# Patient Record
Sex: Male | Born: 1935 | Race: White | Hispanic: No | Smoking: Former smoker
Health system: Southern US, Community
[De-identification: ages and names within clinical notes are randomized; demographics above are authoritative.]

## PROBLEM LIST (undated history)

## (undated) DIAGNOSIS — I10 Essential (primary) hypertension: Secondary | ICD-10-CM

## (undated) DIAGNOSIS — G519 Disorder of facial nerve, unspecified: Secondary | ICD-10-CM

## (undated) DIAGNOSIS — N200 Calculus of kidney: Secondary | ICD-10-CM

## (undated) DIAGNOSIS — R1031 Right lower quadrant pain: Secondary | ICD-10-CM

## (undated) DIAGNOSIS — I619 Nontraumatic intracerebral hemorrhage, unspecified: Secondary | ICD-10-CM

## (undated) DIAGNOSIS — I629 Nontraumatic intracranial hemorrhage, unspecified: Secondary | ICD-10-CM

## (undated) DIAGNOSIS — E039 Hypothyroidism, unspecified: Secondary | ICD-10-CM

## (undated) DIAGNOSIS — I2510 Atherosclerotic heart disease of native coronary artery without angina pectoris: Secondary | ICD-10-CM

## (undated) DIAGNOSIS — E785 Hyperlipidemia, unspecified: Secondary | ICD-10-CM

## (undated) DIAGNOSIS — N281 Cyst of kidney, acquired: Secondary | ICD-10-CM

## (undated) DIAGNOSIS — E119 Type 2 diabetes mellitus without complications: Secondary | ICD-10-CM

## (undated) DIAGNOSIS — Z951 Presence of aortocoronary bypass graft: Secondary | ICD-10-CM

## (undated) HISTORY — DX: Presence of aortocoronary bypass graft: Z95.1

## (undated) HISTORY — DX: Cyst of kidney, acquired: N28.1

## (undated) HISTORY — DX: Right lower quadrant pain: R10.31

## (undated) HISTORY — DX: Nontraumatic intracranial hemorrhage, unspecified: I62.9

## (undated) HISTORY — DX: Hyperlipidemia, unspecified: E78.5

## (undated) HISTORY — DX: Nontraumatic intracerebral hemorrhage, unspecified: I61.9

## (undated) HISTORY — DX: Hypothyroidism, unspecified: E03.9

## (undated) HISTORY — DX: Calculus of kidney: N20.0

## (undated) HISTORY — DX: Type 2 diabetes mellitus without complications: E11.9

## (undated) HISTORY — DX: Disorder of facial nerve, unspecified: G51.9

## (undated) HISTORY — DX: Essential (primary) hypertension: I10

## (undated) HISTORY — DX: Atherosclerotic heart disease of native coronary artery without angina pectoris: I25.10

---

## 2006-08-01 ENCOUNTER — Emergency Department: Payer: Self-pay | Admitting: Emergency Medicine

## 2006-08-01 ENCOUNTER — Other Ambulatory Visit: Payer: Self-pay

## 2012-05-09 HISTORY — PX: CARDIAC SURGERY: SHX584

## 2012-06-11 ENCOUNTER — Ambulatory Visit: Payer: Self-pay | Admitting: Cardiology

## 2012-06-11 DIAGNOSIS — E785 Hyperlipidemia, unspecified: Secondary | ICD-10-CM

## 2012-06-11 DIAGNOSIS — I2510 Atherosclerotic heart disease of native coronary artery without angina pectoris: Secondary | ICD-10-CM

## 2012-06-11 DIAGNOSIS — I10 Essential (primary) hypertension: Secondary | ICD-10-CM

## 2012-06-11 HISTORY — DX: Atherosclerotic heart disease of native coronary artery without angina pectoris: I25.10

## 2012-06-11 HISTORY — DX: Hyperlipidemia, unspecified: E78.5

## 2012-06-11 HISTORY — DX: Essential (primary) hypertension: I10

## 2012-06-15 DIAGNOSIS — E119 Type 2 diabetes mellitus without complications: Secondary | ICD-10-CM

## 2012-06-15 DIAGNOSIS — E039 Hypothyroidism, unspecified: Secondary | ICD-10-CM

## 2012-06-15 DIAGNOSIS — Z951 Presence of aortocoronary bypass graft: Secondary | ICD-10-CM | POA: Insufficient documentation

## 2012-06-15 HISTORY — DX: Presence of aortocoronary bypass graft: Z95.1

## 2012-06-15 HISTORY — DX: Type 2 diabetes mellitus without complications: E11.9

## 2012-06-15 HISTORY — DX: Hypothyroidism, unspecified: E03.9

## 2013-04-25 ENCOUNTER — Emergency Department: Payer: Self-pay | Admitting: Emergency Medicine

## 2013-04-25 DIAGNOSIS — I629 Nontraumatic intracranial hemorrhage, unspecified: Secondary | ICD-10-CM | POA: Insufficient documentation

## 2013-04-25 HISTORY — DX: Nontraumatic intracranial hemorrhage, unspecified: I62.9

## 2013-04-25 LAB — CBC WITH DIFFERENTIAL/PLATELET
Basophil #: 0.1 10*3/uL (ref 0.0–0.1)
Basophil %: 0.4 %
Eosinophil #: 0 10*3/uL (ref 0.0–0.7)
Eosinophil %: 0.2 %
HCT: 42.9 % (ref 40.0–52.0)
HGB: 14.2 g/dL (ref 13.0–18.0)
Lymphocyte #: 1.5 10*3/uL (ref 1.0–3.6)
Lymphocyte %: 8.7 %
MCH: 27.9 pg (ref 26.0–34.0)
MCHC: 33.1 g/dL (ref 32.0–36.0)
MCV: 84 fL (ref 80–100)
Monocyte #: 0.9 x10 3/mm (ref 0.2–1.0)
Monocyte %: 5 %
Neutrophil #: 14.7 10*3/uL — ABNORMAL HIGH (ref 1.4–6.5)
Neutrophil %: 85.7 %
Platelet: 216 10*3/uL (ref 150–440)
RBC: 5.08 10*6/uL (ref 4.40–5.90)
RDW: 13.6 % (ref 11.5–14.5)
WBC: 17.1 10*3/uL — ABNORMAL HIGH (ref 3.8–10.6)

## 2013-04-25 LAB — COMPREHENSIVE METABOLIC PANEL
Alkaline Phosphatase: 87 U/L
Anion Gap: 8 (ref 7–16)
BUN: 18 mg/dL (ref 7–18)
Bilirubin,Total: 0.4 mg/dL (ref 0.2–1.0)
Calcium, Total: 9.9 mg/dL (ref 8.5–10.1)
Co2: 27 mmol/L (ref 21–32)
EGFR (Non-African Amer.): >60
SGOT(AST): 8 U/L — ABNORMAL LOW (ref 15–37)
SGPT (ALT): 19 U/L (ref 12–78)
Sodium: 142 mmol/L (ref 136–145)
Total Protein: 7.2 g/dL (ref 6.4–8.2)

## 2013-04-25 LAB — TROPONIN I: Troponin-I: <0.02 ng/mL

## 2013-04-26 DIAGNOSIS — I619 Nontraumatic intracerebral hemorrhage, unspecified: Secondary | ICD-10-CM

## 2013-04-26 HISTORY — DX: Nontraumatic intracerebral hemorrhage, unspecified: I61.9

## 2013-05-16 DIAGNOSIS — G519 Disorder of facial nerve, unspecified: Secondary | ICD-10-CM

## 2013-05-16 DIAGNOSIS — G510 Bell's palsy: Secondary | ICD-10-CM | POA: Insufficient documentation

## 2013-05-16 HISTORY — DX: Disorder of facial nerve, unspecified: G51.9

## 2015-02-09 ENCOUNTER — Other Ambulatory Visit: Payer: Self-pay | Admitting: Internal Medicine

## 2015-02-09 DIAGNOSIS — R1084 Generalized abdominal pain: Secondary | ICD-10-CM

## 2015-02-11 ENCOUNTER — Ambulatory Visit
Admission: RE | Admit: 2015-02-11 | Discharge: 2015-02-11 | Disposition: A | Discharge status: Home / Self Care | Payer: Medicare Other | Source: Ambulatory Visit | Attending: Internal Medicine | Admitting: Internal Medicine

## 2015-02-11 DIAGNOSIS — R1084 Generalized abdominal pain: Secondary | ICD-10-CM | POA: Diagnosis present

## 2015-02-11 DIAGNOSIS — I714 Abdominal aortic aneurysm, without rupture: Secondary | ICD-10-CM | POA: Diagnosis not present

## 2015-02-11 DIAGNOSIS — N281 Cyst of kidney, acquired: Secondary | ICD-10-CM | POA: Diagnosis not present

## 2015-03-13 ENCOUNTER — Ambulatory Visit: Payer: Self-pay | Admitting: Urology

## 2015-03-17 ENCOUNTER — Ambulatory Visit (INDEPENDENT_AMBULATORY_CARE_PROVIDER_SITE_OTHER): Payer: Medicare Other | Admitting: Urology

## 2015-03-17 ENCOUNTER — Encounter: Payer: Self-pay | Admitting: Urology

## 2015-03-17 VITALS — BP 136/89 | HR 102 | Ht 68.0 in | Wt 163.5 lb

## 2015-03-17 DIAGNOSIS — N281 Cyst of kidney, acquired: Secondary | ICD-10-CM

## 2015-03-17 DIAGNOSIS — N200 Calculus of kidney: Secondary | ICD-10-CM

## 2015-03-17 DIAGNOSIS — Q6100 Congenital renal cyst, unspecified: Secondary | ICD-10-CM

## 2015-03-17 DIAGNOSIS — R1031 Right lower quadrant pain: Secondary | ICD-10-CM | POA: Diagnosis not present

## 2015-03-17 HISTORY — DX: Calculus of kidney: N20.0

## 2015-03-17 HISTORY — DX: Right lower quadrant pain: R10.31

## 2015-03-17 HISTORY — DX: Cyst of kidney, acquired: N28.1

## 2015-03-17 NOTE — Progress Notes (Signed)
03/17/2015 3:01 PM   Christopher Parrish 1935-09-16 [id number]  Referring provider: Jaclyn Shaggy, MD [address]   [place], [place] [address]  Chief Complaint  Patient presents with  . New Patient (Initial Visit)    bilateral renal cyst     HPI:  1 - Bilateral Renal Cysts - Rt 8cm, Lt 3cm renal cysts by [place] 02/2015 on eval RLQ pain. No obvious hypervascular nodules / mass effect / hydro. No contrast axial imaging previously.  2 - Nephrolithiasis - prior medical passage in 1970s and 1980s. No prior instrumentation.  3 - Right Lower Quadrant Pain - pt with mild bother form occasional RLQ pain. No relation to full / empty bladder or full / empty bowel. No hernias on exam. Does not localize to flank / renal area. Overall modest bother.  PMH sig for CAD/CABG (presently non-limiting). He is retired from [employer] " is seen as new patient for above.    PMH: Past Medical History  Diagnosis Date  . Arteriosclerosis of coronary artery 06/11/2012  . BP (high blood pressure) 06/11/2012  . Diabetes mellitus (HCC) 06/15/2012  . Adult hypothyroidism 06/15/2012  . HLD (hyperlipidemia) 06/11/2012    Surgical History: No past surgical history on file.  Home Medications:    Medication List       This list is accurate as of: 03/17/15  3:01 PM.  Always use your most recent med list.               aspirin EC 81 MG tablet  Take by mouth.     BENADRYL 25 MG tablet  Generic drug:  diphenhydrAMINE  Take 25 mg by mouth every 6 (six) hours as needed.     levothyroxine 100 MCG tablet  Commonly known as:  SYNTHROID, LEVOTHROID  Take 100 mcg by mouth daily before breakfast.     lisinopril-hydrochlorothiazide 10-12.5 MG tablet  Commonly known as:  PRINZIDE,ZESTORETIC  Take 1 tablet by mouth daily.     loratadine 10 MG tablet  Commonly known as:  CLARITIN  Take by mouth.     omeprazole 20 MG capsule  Commonly known as:  PRILOSEC  Take by mouth.         Allergies: No Known Allergies  Family History: No family history on file.  Social History:  has no tobacco, alcohol, and drug history on file.  ROS:     Gastrointestinal (upper)  : Negative for upper GI symptoms  Gastrointestinal (lower) : Negative for lower GI symptoms  Constitutional : Negative for symptoms  Skin: Negative for skin symptoms  Eyes: Negative for eye symptoms  Ear/Nose/Throat : Negative for Ear/Nose/Throat symptoms  Hematologic/Lymphatic: Negative for Hematologic/Lymphatic symptoms  Cardiovascular : Negative for cardiovascular symptoms  Respiratory : Negative for respiratory symptoms  Endocrine: Negative for endocrine symptoms  Musculoskeletal: Negative for musculoskeletal symptoms  Neurological: Negative for neurological symptoms  Psychologic: Negative for psychiatric symptoms       Physical Exam: There were no vitals taken for this visit.  Constitutional:  Alert and oriented, No acute distress. HEENT: [place] AT, moist mucus membranes.  Trachea midline, no masses. Cardiovascular: No clubbing, cyanosis, or edema. Respiratory: Normal respiratory effort, no increased work of breathing. GI: Abdomen is soft, nontender, nondistended, no abdominal masses GU: No CVA tenderness. Very mild RLQ pain withtou rebound. No palpable mases.  Skin: No rashes, bruises or suspicious lesions. Lymph: No cervical or inguinal adenopathy. Neurologic: Grossly intact, no focal deficits,  moving all 4 extremities. Psychiatric: Normal mood and affect.  Laboratory Data: Lab Results  Component Value Date   WBC 17.1* 04/25/2013   HGB 14.2 04/25/2013   HCT 42.9 04/25/2013   MCV 84 04/25/2013   PLT 216 04/25/2013    Lab Results  Component Value Date   CREATININE 1.02 04/25/2013    No results found for: PSA  No results found for: TESTOSTERONE  No results found for: HGBA1C  Urinalysis No results found for: COLORURINE, APPEARANCEUR, LABSPEC,  PHURINE, GLUCOSEU, HGBUR, BILIRUBINUR, KETONESUR, PROTEINUR, UROBILINOGEN, NITRITE, LEUKOCYTESUR  Pertinent Imaging:   Assessment & Plan:  1 - Bilateral Renal Cysts - likely benign cysts. Rec CT abd-pelvis with and without on return to maximally r/o neoplasm or major mass effect. This will also help r/o worriseom etiology to abd pain as per below.   2 - Nephrolithiasis - no recent colic. Imaging on return to r/o any sig ureteral stones as cause of below symptoms.   3 - Right Lower Quadrant Pain - exam reassuring, favor MSK source. Imaging as per above to r/o stone or GU neoplastic etiology.   4 - BMP today, RTC with CT, if all is favorable, then prn.   No Follow-up on file.  Sebastian AcheMANNY, Katja Blue, MD  [facility] [address], [address] [place], [place] [address] [phone]

## 2015-03-18 LAB — BASIC METABOLIC PANEL
BUN / CREAT RATIO: 14 (ref 10–22)
BUN: 15 mg/dL (ref 8–27)
CO2: 26 mmol/L (ref 18–29)
CREATININE: 1.04 mg/dL (ref 0.76–1.27)
Calcium: 10.1 mg/dL (ref 8.6–10.2)
Chloride: 98 mmol/L (ref 97–106)
GFR calc Af Amer: 80 mL/min/{1.73_m2} (ref >59)
GFR, EST NON AFRICAN AMERICAN: 69 mL/min/{1.73_m2} (ref >59)
GLUCOSE: 98 mg/dL (ref 65–99)
Potassium: 4 mmol/L (ref 3.5–5.2)
SODIUM: 140 mmol/L (ref 136–144)

## 2015-03-24 ENCOUNTER — Ambulatory Visit
Admission: RE | Admit: 2015-03-24 | Discharge: 2015-03-24 | Disposition: A | Discharge status: Home / Self Care | Payer: Medicare Other | Source: Ambulatory Visit | Attending: Urology | Admitting: Urology

## 2015-03-24 DIAGNOSIS — N200 Calculus of kidney: Secondary | ICD-10-CM | POA: Insufficient documentation

## 2015-03-24 DIAGNOSIS — N281 Cyst of kidney, acquired: Secondary | ICD-10-CM | POA: Diagnosis not present

## 2015-03-24 DIAGNOSIS — R1031 Right lower quadrant pain: Secondary | ICD-10-CM

## 2015-03-24 MED ORDER — IOHEXOL 300 MG/ML  SOLN
100.0000 mL | Freq: Once | INTRAMUSCULAR | Status: AC | PRN
  Administered 2015-03-24: 100 mL via INTRAVENOUS

## 2015-04-08 ENCOUNTER — Encounter: Payer: Self-pay | Admitting: Urology

## 2015-04-08 ENCOUNTER — Ambulatory Visit (INDEPENDENT_AMBULATORY_CARE_PROVIDER_SITE_OTHER): Payer: Medicare Other | Admitting: Urology

## 2015-04-08 VITALS — BP 145/77 | HR 85 | Ht 68.0 in | Wt 168.3 lb

## 2015-04-08 DIAGNOSIS — N200 Calculus of kidney: Secondary | ICD-10-CM

## 2015-04-08 DIAGNOSIS — Q6102 Congenital multiple renal cysts: Secondary | ICD-10-CM

## 2015-04-08 DIAGNOSIS — N281 Cyst of kidney, acquired: Secondary | ICD-10-CM

## 2015-04-08 NOTE — Progress Notes (Signed)
04/08/2015 2:48 PM   Arsenio Lacretia Nicks Excell Seltzer 09/26/1935 [id number]  Referring provider: Jaclyn Shaggy, MD [address]   [place], [place] [address]  Chief Complaint  Patient presents with  . Follow-up    CT scan, result cyst    HPI: 1 - Bilateral Renal Cysts - the patient bilateral benign renal cysts on CT scan.  2 - Nephrolithiasis - the patient has a 1.1 cm right upper pole stone and a 7 mm left lower pole stone.  3 - Right Lower Quadrant Pain - there is no radiological or physical exam findings to suggest a urological cause.  PMH sig for CAD/CABG (presently non-limiting). He is retired from [employer]     PMH: Past Medical History  Diagnosis Date  . Arteriosclerosis of coronary artery 06/11/2012  . BP (high blood pressure) 06/11/2012  . Adult hypothyroidism 06/15/2012  . HLD (hyperlipidemia) 06/11/2012    Surgical History: Past Surgical History  Procedure Laterality Date  . Cardiac surgery  2014    bypass    Home Medications:    Medication List       This list is accurate as of: 04/08/15  2:48 PM.  Always use your most recent med list.               aspirin EC 81 MG tablet  Take by mouth.     BENADRYL 25 MG tablet  Generic drug:  diphenhydrAMINE  Take 25 mg by mouth every 6 (six) hours as needed.     levothyroxine 100 MCG tablet  Commonly known as:  SYNTHROID, LEVOTHROID  Take 100 mcg by mouth daily before breakfast.     lisinopril-hydrochlorothiazide 10-12.5 MG tablet  Commonly known as:  PRINZIDE,ZESTORETIC  Take 1 tablet by mouth daily.     loratadine 10 MG tablet  Commonly known as:  CLARITIN  Take by mouth.     omeprazole 20 MG capsule  Commonly known as:  PRILOSEC  Take by mouth.        Allergies: No Known Allergies  Family History: Family History  Problem Relation Age of Onset  . Cancer Neg Hx   . Diabetes Brother     Social History:  reports that he quit smoking about 27 years ago. He does not have any smokeless  tobacco history on file. He reports that he does not drink alcohol or use illicit drugs.  ROS: UROLOGY Frequent Urination?: No Hard to postpone urination?: No Burning/pain with urination?: No Get up at night to urinate?: No Leakage of urine?: No Urine stream starts and stops?: No Trouble starting stream?: No Do you have to strain to urinate?: No Blood in urine?: No Urinary tract infection?: No Sexually transmitted disease?: No Injury to kidneys or bladder?: No Painful intercourse?: No Weak stream?: No Erection problems?: No Penile pain?: No  Gastrointestinal Nausea?: No Vomiting?: No Indigestion/heartburn?: No Diarrhea?: No Constipation?: No  Constitutional Fever: No Night sweats?: No Weight loss?: No Fatigue?: No  Skin Skin rash/lesions?: No Itching?: No  Eyes Blurred vision?: No Double vision?: No  Ears/Nose/Throat Sore throat?: No Sinus problems?: No  Hematologic/Lymphatic Swollen glands?: No Easy bruising?: No  Cardiovascular Chest pain?: No  Respiratory Cough?: No Shortness of breath?: No  Endocrine Excessive thirst?: No  Musculoskeletal Back pain?: No Joint pain?: No  Neurological Headaches?: No Dizziness?: No  Psychologic Depression?: No Anxiety?: No  Physical Exam: BP 145/77 mmHg  Pulse 85  Ht  (1.727 m)  Wt 168 lb 4.8 oz (  76.34 kg)  BMI 25.60 kg/m2  Constitutional:  Alert and oriented, No acute distress. HEENT: [place] AT, moist mucus membranes.  Trachea midline, no masses. Cardiovascular: No clubbing, cyanosis, or edema. Respiratory: Normal respiratory effort, no increased work of breathing. GI: Abdomen is soft, nontender, nondistended, no abdominal masses GU: No CVA tenderness.  Skin: No rashes, bruises or suspicious lesions. Lymph: No cervical or inguinal adenopathy. Neurologic: Grossly intact, no focal deficits, moving all 4 extremities. Psychiatric: Normal mood and affect.  Laboratory Data: Lab Results  Component  Value Date   WBC 17.1* 04/25/2013   HGB 14.2 04/25/2013   HCT 42.9 04/25/2013   MCV 84 04/25/2013   PLT 216 04/25/2013    Lab Results  Component Value Date   CREATININE 1.04 03/17/2015    No results found for: PSA  No results found for: TESTOSTERONE  No results found for: HGBA1C  Urinalysis No results found for: COLORURINE, APPEARANCEUR, LABSPEC, PHURINE, GLUCOSEU, HGBUR, BILIRUBINUR, KETONESUR, PROTEINUR, UROBILINOGEN, NITRITE, LEUKOCYTESUR  Pertinent Imaging: IMPRESSION: 1. No acute findings within the abdomen or pelvis. 2. Bilateral renal cysts. 3. Nonobstructing renal calculi. 4. Aortic tortuosity and atherosclerosis. 5. Enhancing nodule within the central portion of the prostate gland.  Assessment & Plan:    1 - Bilateral Renal Cysts - likely benign cysts. Rec CT abd-pelvis with and without on return to maximally r/o neoplasm or major mass effect. This will also help r/o worriseom etiology to abd pain as per below.   2 - Nephrolithiasis -I discussed the various treatment options with the patient including ESWL and ureteroscopy. He is aware the stones may become symptomatic at some point or continue to grow. At this time, he is not interested in pursuing treatment as he does not feel bothered by the stones. We will proceed then with watchful waiting of stones. He'll follow-up in one year's time for a KUB to ensure stones aren't growing significantly in size.  3 - Right Lower Quadrant Pain - likely musculoskeletal in origin    Return in about 1 year (around 04/07/2016) for with KUB prior.  Hildred LaserBrian James Arayah Krouse, MD  [facility] [address], [address] [place], [place] [address] [phone]

## 2015-12-15 ENCOUNTER — Other Ambulatory Visit: Payer: Self-pay

## 2016-04-07 ENCOUNTER — Ambulatory Visit: Payer: Medicare Other

## 2016-09-05 ENCOUNTER — Other Ambulatory Visit: Payer: Self-pay | Admitting: Internal Medicine

## 2016-09-05 DIAGNOSIS — I714 Abdominal aortic aneurysm, without rupture, unspecified: Secondary | ICD-10-CM

## 2016-09-07 ENCOUNTER — Ambulatory Visit
Admission: RE | Admit: 2016-09-07 | Discharge: 2016-09-07 | Disposition: A | Discharge status: Home / Self Care | Payer: Medicare Other | Source: Ambulatory Visit | Attending: Internal Medicine | Admitting: Internal Medicine

## 2016-09-07 DIAGNOSIS — I714 Abdominal aortic aneurysm, without rupture, unspecified: Secondary | ICD-10-CM

## 2016-09-07 DIAGNOSIS — N281 Cyst of kidney, acquired: Secondary | ICD-10-CM | POA: Insufficient documentation

## 2017-01-08 ENCOUNTER — Emergency Department: Payer: Medicare Other

## 2017-01-08 ENCOUNTER — Encounter: Payer: Self-pay | Admitting: Emergency Medicine

## 2017-01-08 ENCOUNTER — Emergency Department
Admission: EM | Admit: 2017-01-08 | Discharge: 2017-01-08 | Disposition: A | Discharge status: Home / Self Care | Payer: Medicare Other | Attending: Emergency Medicine | Admitting: Emergency Medicine

## 2017-01-08 DIAGNOSIS — I2510 Atherosclerotic heart disease of native coronary artery without angina pectoris: Secondary | ICD-10-CM | POA: Insufficient documentation

## 2017-01-08 DIAGNOSIS — M545 Low back pain: Secondary | ICD-10-CM | POA: Diagnosis present

## 2017-01-08 DIAGNOSIS — M5442 Lumbago with sciatica, left side: Secondary | ICD-10-CM | POA: Diagnosis not present

## 2017-01-08 DIAGNOSIS — E119 Type 2 diabetes mellitus without complications: Secondary | ICD-10-CM | POA: Insufficient documentation

## 2017-01-08 DIAGNOSIS — Z7982 Long term (current) use of aspirin: Secondary | ICD-10-CM | POA: Insufficient documentation

## 2017-01-08 DIAGNOSIS — Z87891 Personal history of nicotine dependence: Secondary | ICD-10-CM | POA: Diagnosis not present

## 2017-01-08 DIAGNOSIS — M5432 Sciatica, left side: Secondary | ICD-10-CM

## 2017-01-08 DIAGNOSIS — Z79899 Other long term (current) drug therapy: Secondary | ICD-10-CM | POA: Insufficient documentation

## 2017-01-08 DIAGNOSIS — E039 Hypothyroidism, unspecified: Secondary | ICD-10-CM | POA: Diagnosis not present

## 2017-01-08 LAB — URINALYSIS, COMPLETE (UACMP) WITH MICROSCOPIC
BACTERIA UA: NONE SEEN
Bilirubin Urine: NEGATIVE
Glucose, UA: NEGATIVE mg/dL
Hgb urine dipstick: NEGATIVE
KETONES UR: NEGATIVE mg/dL
Leukocytes, UA: NEGATIVE
Nitrite: NEGATIVE
PH: 7 (ref 5.0–8.0)
Protein, ur: NEGATIVE mg/dL
SPECIFIC GRAVITY, URINE: 1.011 (ref 1.005–1.030)
SQUAMOUS EPITHELIAL / LPF: NONE SEEN

## 2017-01-08 MED ORDER — CYCLOBENZAPRINE HCL 5 MG PO TABS: 5.0000 mg | ORAL_TABLET | Freq: Three times a day (TID) | ORAL | 0 refills | Status: AC | PRN

## 2017-01-08 MED ORDER — PREDNISONE 10 MG (21) PO TBPK: ORAL_TABLET | ORAL | 0 refills | Status: DC

## 2017-01-08 MED ORDER — DEXAMETHASONE SODIUM PHOSPHATE 10 MG/ML IJ SOLN
10.0000 mg | Freq: Once | INTRAMUSCULAR | Status: AC
  Administered 2017-01-08: 10 mg via INTRAMUSCULAR
  Filled 2017-01-08: qty 1

## 2017-01-08 NOTE — Discharge Instructions (Signed)
Take medication as prescribed. Return to emergency department if symptoms worsen and follow-up with PCP as needed.    Modify activity until symptoms improve. Refrain from heavy lifting and twisting, long-term sitting (greater than one hour), or other activities that increase current symptoms.

## 2017-01-08 NOTE — ED Notes (Signed)
Pt with left leg pain that comes and goes. No pain now. Able to bear weight. +2 pulses. Skin color normal, warm

## 2017-01-08 NOTE — ED Notes (Signed)
Pt ambulatory in triage without difficulty, offered wheelchair, but declined, steady gait.  Pulses intact in lower extremities no color change or swelling noted.

## 2017-01-08 NOTE — ED Provider Notes (Signed)
[facility] Emergency Department Provider Note   ____________________________________________   I have reviewed the triage vital signs and the nursing notes.   HISTORY  Chief Complaint Back Pain and Leg Pain    HPI Christopher Parrish is a 81 y.o. male presents to the emergency department with left-sided lumbar pain and radiating pain down the left lower extremity without traumatic injury. Patient reports very remote back injury that occurred in the late 70s that he recovered from completely since that time no other injury noted. Patient reported onset of left lower extremity pain radiating to the foot that began on Friday. He reported taking Tylenol without improvement of symptoms. Patient reports worsening left-sided lumbar pain today without change in left lower extremity symptoms. Patient reports pain and denies change in sensation or strength to the left lower extremity. Patient denies bowel/bladder dysfunction or saddle anesthesia. Patient reports a history of kidney stones that generally occur on the right side however he has experienced kidney stones on one occasion on the left side. Patient does endorse left lower abdominal pain. Patient denies dysuria, decreased flow or increased frequency. Patient denies flank pain. Patient denies fever, chills, headache, vision changes, chest pain, chest tightness, shortness of breath, abdominal pain, nausea and vomiting.  Past Medical History:  Diagnosis Date  . Adult hypothyroidism 06/15/2012  . Arteriosclerosis of coronary artery 06/11/2012  . BP (high blood pressure) 06/11/2012  . HLD (hyperlipidemia) 06/11/2012    Patient Active Problem List   Diagnosis Date Noted  . 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 Right lower quadrant pain 03/17/2015  . Nephrolithiasis 03/17/2015  . 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 Hemorrhagic cerebrovascular accident (CVA) (HCC) 04/26/2013  . Intracranial hemorrhage (HCC) 04/25/2013  . Diabetes  mellitus (HCC) 06/15/2012  . Adult hypothyroidism 06/15/2012  . H/O coronary artery bypass surgery 06/15/2012  . Arteriosclerosis of coronary artery 06/11/2012  . HLD (hyperlipidemia) 06/11/2012  . BP (high blood pressure) 06/11/2012    Past Surgical History:  Procedure Laterality Date  . CARDIAC SURGERY  2014   bypass    Prior to Admission medications   Medication Sig Start Date End Date Taking? Authorizing Provider  aspirin EC 81 MG tablet Take by mouth.    [provider]  cyclobenzaprine (FLEXERIL) 5 MG tablet Take 1 tablet (5 mg total) by mouth 3 (three) times daily as needed. 01/08/17   Theresa Dohrman M, PA-C  diphenhydrAMINE (BENADRYL) 25 MG tablet Take 25 mg by mouth every 6 (six) hours as needed.    [provider]  levothyroxine (SYNTHROID, LEVOTHROID) 100 MCG tablet Take 100 mcg by mouth daily before breakfast.    [provider]  lisinopril-hydrochlorothiazide (PRINZIDE,ZESTORETIC) 10-12.5 MG tablet Take 1 tablet by mouth daily.    [provider]  loratadine (CLARITIN) 10 MG tablet Take by mouth.    [provider]  omeprazole (PRILOSEC) 20 MG capsule Take by mouth.    [provider]  predniSONE (STERAPRED UNI-PAK 21 TAB) 10 MG (21) TBPK tablet Take 6 tablets on day 1. Take 5 tablets on day 2. Take 4 tablets on day 3. Take 3 tablets on day 4. Take 2 tablets on day 5. Take 1 tablets on day 6. 01/08/17   Chang Tiggs M, PA-C    Allergies Patient has no known allergies.  Family History  Problem Relation Age of Onset  . Diabetes Brother   . Cancer Neg Hx     Social History Social History  Substance Use Topics  .  Smoking status: Former Smoker    Quit date: 05/10/1987  . Smokeless tobacco: Never Used  . Alcohol use No    Review of Systems Constitutional: Negative for fever/chills Eyes: No visual changes. ENT:  Negative for sore throat and for difficulty swallowing Cardiovascular: Denies chest pain. Respiratory:  Denies cough. Denies shortness of breath. Gastrointestinal: No abdominal pain.  No nausea, vomiting, diarrhea. Genitourinary: Negative for dysuria, increased frequency or decreased urine flow. Musculoskeletal: Positive for lumbar back pain and left lower extremity pain Skin: Negative for rash. Neurological: Negative for headaches.  Negative focal weakness or numbness.. Able to ambulate. ____________________________________________   PHYSICAL EXAM:  VITAL SIGNS: ED Triage Vitals  Enc Vitals Group     BP 01/08/17 1619 (!) 143/69     Pulse Rate 01/08/17 1619 83     Resp 01/08/17 1619 16     Temp 01/08/17 1619 98.2 F (36.8 C)     Temp Source 01/08/17 1619 Oral     SpO2 01/08/17 1619 97 %     Weight 01/08/17 1620 145 lb (65.8 kg)     Height 01/08/17 1620 5\' 8"  (1.727 m)     Head Circumference --      Peak Flow --      Pain Score 01/08/17 1623 7     Pain Loc --      Pain Edu? --      Excl. in GC? --     Constitutional: Alert and oriented. Well appearing and in no acute distress.  Eyes: Conjunctivae are normal. PERRL. EOMI  Head: Normocephalic and atraumatic. ENT:      Ears: Canals clear. TMs intact bilaterally.      Nose: No congestion/rhinnorhea.      Mouth/Throat: Mucous membranes are moist. Neck:Supple. No thyromegaly. No stridor.  Cardiovascular: Normal rate, regular rhythm. Normal S1 and S2.  Good peripheral circulation. Respiratory: Normal respiratory effort without tachypnea or retractions. Lungs CTAB. No wheezes/rales/rhonchi. Good air entry to the bases with no decreased or absent breath sounds. Hematological/Lymphatic/Immunological: No cervical lymphadenopathy. Cardiovascular: Normal rate, regular rhythm. Normal distal pulses. Gastrointestinal: Bowel sounds 4 quadrants. Soft and nontender to palpation. No guarding or rigidity. No palpable masses. No distention. No CVA tenderness. Genitourinary: Negative for dysuria, increased frequency or decreased urine  flow. Musculoskeletal: Left side lumbar back pain with palpable tenderness along lumbar paraspinals. Intact lumbar range of motion without deformities noted. No spinous process tenderness. Left lower extremity radicular pain to the foot. Intact left lower extremity strength. Negative bowel/bladder dysfunction or saddle anesthesia. Neurologic: Normal speech and language. No gross focal neurologic deficits are appreciatedNo sensory loss or abnormal reflexes.  Skin:  Skin is warm, dry and intact. No rash noted. Psychiatric: Mood and affect are normal. Speech and behavior are normal. Patient exhibits appropriate insight and judgement.  ____________________________________________   LABS (all labs ordered are listed, but only abnormal results are displayed)  Labs Reviewed  URINALYSIS, COMPLETE (UACMP) WITH MICROSCOPIC - Abnormal; Notable for the following:       Result Value   Color, Urine YELLOW (*)    APPearance CLEAR (*)    All other components within normal limits   ____________________________________________  EKG None ____________________________________________  RADIOLOGY DG lumbar spine FINDINGS: This report assumes 5 non rib-bearing lumbar vertebrae.  Lumbar vertebral body heights are preserved, with no fracture.  Moderate to severe multilevel degenerative disc disease in the visualized thoracolumbar spine, most prominent at T12-L1, L1-2 and L5-S1, not appreciably changed. Stable 4 mm retrolisthesis at L2-3,  3 mm retrolisthesis at L3-4 and 2 mm retrolisthesis at L4-5. No new spondylolisthesis. Moderate facet arthropathy bilaterally in the lower lumbar spine. No aggressive appearing focal osseous lesions. Atherosclerotic abdominal aorta.  IMPRESSION: 1. No lumbar spine fracture or acute malalignment. 2. Moderate to severe multilevel degenerative disc disease, facet arthropathy and mild multilevel spondylolisthesis, not  appreciably changed.  ____________________________________________   PROCEDURES  Procedure(s) performed: no    Critical Care performed: no ____________________________________________   INITIAL IMPRESSION / ASSESSMENT AND PLAN / ED COURSE  Pertinent labs & imaging results that were available during my care of the patient were reviewed by me and considered in my medical decision making (see chart for details).  Patient presents to emergency department with left-sided lumbar back pain with left-sided radiculopathy, sciatica. History, physical exam findings and imaging are reassuring symptoms are consistent with lumbar nerve impingement, sciatica with associated muscle spasms. Patient noted improvement of symptoms following Decadron given during the course of care in the emergency department. Patient will be prescribed prednisone taper and Flexeril as needed for muscle spasms. Patient advised to follow up with PCP as needed or return to the emergency department if symptoms return or worsen. Patient informed of clinical course, understand medical decision-making process, and agree with plan.       _____________________   FINAL CLINICAL IMPRESSION(S) / ED DIAGNOSES  Final diagnoses:  Acute left-sided low back pain with left-sided sciatica  Sciatica of left side       NEW MEDICATIONS STARTED DURING THIS VISIT:  New Prescriptions   CYCLOBENZAPRINE (FLEXERIL) 5 MG TABLET    Take 1 tablet (5 mg total) by mouth 3 (three) times daily as needed.   PREDNISONE (STERAPRED UNI-PAK 21 TAB) 10 MG (21) TBPK TABLET    Take 6 tablets on day 1. Take 5 tablets on day 2. Take 4 tablets on day 3. Take 3 tablets on day 4. Take 2 tablets on day 5. Take 1 tablets on day 6.     Note:  This document was prepared using Dragon voice recognition software and may include unintentional dictation errors.    Percell Boston 01/08/17 1937    Emily Filbert, MD 01/08/17 2027

## 2017-01-08 NOTE — ED Triage Notes (Signed)
Pt reports pain from left leg up to lower back. Pt states the pain started on Friday, took Tylenol last night.  Pt reports he has pain when he walks and feels a catch in his leg at times.  He describes the pain as sharp that is brief.  Pt states he gets a lot of pain in his left thigh that comes and goes.

## 2017-02-05 ENCOUNTER — Emergency Department
Admission: EM | Admit: 2017-02-05 | Discharge: 2017-02-05 | Disposition: A | Discharge status: Home / Self Care | Payer: Medicare Other | Attending: Emergency Medicine | Admitting: Emergency Medicine

## 2017-02-05 DIAGNOSIS — K4091 Unilateral inguinal hernia, without obstruction or gangrene, recurrent: Secondary | ICD-10-CM | POA: Diagnosis not present

## 2017-02-05 DIAGNOSIS — I10 Essential (primary) hypertension: Secondary | ICD-10-CM | POA: Insufficient documentation

## 2017-02-05 DIAGNOSIS — Z7982 Long term (current) use of aspirin: Secondary | ICD-10-CM | POA: Insufficient documentation

## 2017-02-05 DIAGNOSIS — Z8673 Personal history of transient ischemic attack (TIA), and cerebral infarction without residual deficits: Secondary | ICD-10-CM | POA: Diagnosis not present

## 2017-02-05 DIAGNOSIS — E039 Hypothyroidism, unspecified: Secondary | ICD-10-CM | POA: Insufficient documentation

## 2017-02-05 DIAGNOSIS — M5410 Radiculopathy, site unspecified: Secondary | ICD-10-CM | POA: Insufficient documentation

## 2017-02-05 DIAGNOSIS — I2510 Atherosclerotic heart disease of native coronary artery without angina pectoris: Secondary | ICD-10-CM | POA: Insufficient documentation

## 2017-02-05 DIAGNOSIS — M79605 Pain in left leg: Secondary | ICD-10-CM | POA: Diagnosis present

## 2017-02-05 DIAGNOSIS — Z87891 Personal history of nicotine dependence: Secondary | ICD-10-CM | POA: Diagnosis not present

## 2017-02-05 MED ORDER — ACETAMINOPHEN 500 MG PO TABS
1000.0000 mg | ORAL_TABLET | Freq: Once | ORAL | Status: AC
  Administered 2017-02-05: 1000 mg via ORAL
  Filled 2017-02-05: qty 2

## 2017-02-05 MED ORDER — HYDROCODONE-ACETAMINOPHEN 5-325 MG PO TABS: 1.0000 | ORAL_TABLET | Freq: Four times a day (QID) | ORAL | 0 refills | Status: AC | PRN

## 2017-02-05 MED ORDER — IBUPROFEN 600 MG PO TABS
600.0000 mg | ORAL_TABLET | Freq: Once | ORAL | Status: AC
  Administered 2017-02-05: 600 mg via ORAL
  Filled 2017-02-05: qty 1

## 2017-02-05 NOTE — ED Triage Notes (Signed)
Pt states that he has been dealing with sciatica for the past 4 weeks, states that it isn't any better and now has the intermittent sensation of numbness and tingling with the pain. Pt is tachycardic at 124 in triage but denies feeling his heart beating fast

## 2017-02-05 NOTE — ED Provider Notes (Signed)
[facility] Emergency Department Provider Note  ____________________________________________   First MD Initiated Contact with Patient 02/05/17 1916     (approximate)  I have reviewed the triage vital signs and the nursing notes.   HISTORY  Chief Complaint Leg Pain   HPI UNDREA SHIPES is a 81 y.o. male who self presents to the emergency department with 2 complaints. First she reports roughly 1 month of sciatica to his left side. He has moderate severity pain although it is primarily numbness that is bothering him. It begins in his left low back radiating across his left thigh to his anterior thigh just above the knee. He also has some intermittent symptoms lower. He seen in our emergency department roughly 3 weeks ago and had a plain film of his low back and was diagnosed with sciatica and given symptomatic treatment. He also reports several weeks of a bulge in his left groin worse with standing up and improved with rest. He has no bowel or bladder incontinence or heaviness. No trauma. No perianal sensory changes.   Past Medical History:  Diagnosis Date  . Adult hypothyroidism 06/15/2012  . Arteriosclerosis of coronary artery 06/11/2012  . BP (high blood pressure) 06/11/2012  . HLD (hyperlipidemia) 06/11/2012    Patient Active Problem List   Diagnosis Date Noted  . 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 Right lower quadrant pain 03/17/2015  . Nephrolithiasis 03/17/2015  . 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 Hemorrhagic cerebrovascular accident (CVA) (HCC) 04/26/2013  . Intracranial hemorrhage (HCC) 04/25/2013  . Diabetes mellitus (HCC) 06/15/2012  . Adult hypothyroidism 06/15/2012  . H/O coronary artery bypass surgery 06/15/2012  . Arteriosclerosis of coronary artery 06/11/2012  . HLD (hyperlipidemia) 06/11/2012  . BP (high blood pressure) 06/11/2012    Past Surgical History:  Procedure Laterality Date  . CARDIAC SURGERY  2014   bypass    Prior to  Admission medications   Medication Sig Start Date End Date Taking? Authorizing Provider  aspirin EC 81 MG tablet Take by mouth.    [provider]  cyclobenzaprine (FLEXERIL) 5 MG tablet Take 1 tablet (5 mg total) by mouth 3 (three) times daily as needed. 01/08/17   Little, Traci M, PA-C  diphenhydrAMINE (BENADRYL) 25 MG tablet Take 25 mg by mouth every 6 (six) hours as needed.    [provider]  HYDROcodone-acetaminophen (NORCO) 5-325 MG tablet Take 1 tablet by mouth every 6 (six) hours as needed for severe pain. 02/05/17   Merrily Brittle, MD  levothyroxine (SYNTHROID, LEVOTHROID) 100 MCG tablet Take 100 mcg by mouth daily before breakfast.    [provider]  lisinopril-hydrochlorothiazide (PRINZIDE,ZESTORETIC) 10-12.5 MG tablet Take 1 tablet by mouth daily.    [provider]  loratadine (CLARITIN) 10 MG tablet Take by mouth.    [provider]  omeprazole (PRILOSEC) 20 MG capsule Take by mouth.    [provider]  predniSONE (STERAPRED UNI-PAK 21 TAB) 10 MG (21) TBPK tablet Take 6 tablets on day 1. Take 5 tablets on day 2. Take 4 tablets on day 3. Take 3 tablets on day 4. Take 2 tablets on day 5. Take 1 tablets on day 6. 01/08/17   Little, Traci M, PA-C    Allergies Patient has no known allergies.  Family History  Problem Relation Age of Onset  . Diabetes Brother   . Cancer Neg Hx     Social History Social History  Substance Use Topics  . Smoking status: Former Smoker  Quit date: 05/10/1987  . Smokeless tobacco: Never Used  . Alcohol use No    Review of Systems Constitutional: No fever/chills Eyes: No visual changes. ENT: No sore throat. Cardiovascular: Denies chest pain. Respiratory: Denies shortness of breath. Gastrointestinal: No abdominal pain.  No nausea, no vomiting.  No diarrhea.  No constipation. Genitourinary: Negative for dysuria. Musculoskeletal: positive for back pain. Skin: Negative for rash. Neurological:  Negative for headaches, focal weakness or numbness.   ____________________________________________   PHYSICAL EXAM:  VITAL SIGNS: ED Triage Vitals  Enc Vitals Group     BP 02/05/17 1838 124/79     Pulse Rate 02/05/17 1838 (!) 124     Resp 02/05/17 1838 13     Temp 02/05/17 1838 98.3 F (36.8 C)     Temp Source 02/05/17 1838 Oral     SpO2 02/05/17 1838 96 %     Weight 02/05/17 1838 130 lb (59 kg)     Height 02/05/17 1838  (1.727 m)     Head Circumference --      Peak Flow --      Pain Score 02/05/17 1841 5     Pain Loc --      Pain Edu? --      Excl. in GC? --     Constitutional: alert and oriented 4 well appearing nontoxic no diaphoresis speaks full clear sentences Eyes: PERRL EOMI. Head: Atraumatic. Nose: No congestion/rhinnorhea. Mouth/Throat: No trismus Neck: No stridor.   Cardiovascular: Normal rate, regular rhythm. Grossly normal heart sounds.  Good peripheral circulation. Respiratory: Normal respiratory effort.  No retractions. Lungs CTAB and moving good air Gastrointestinal: soft nontender small left inguinal hernia easily reduced with no skin changes Musculoskeletal: 5 out of 5 hip flexion and hip extension plantar flexion dorsiflexion and flexion and extension sensation intact to light touch 2+ DTRs no ankle clonus ambulates with steady gait both legs Negative straight leg raise and cross  Neurologic:  Normal speech and language. No gross focal neurologic deficits are appreciated. Skin:  Skin is warm, dry and intact. No rash noted. Psychiatric: Mood and affect are normal. Speech and behavior are normal.    ____________________________________________   DIFFERENTIAL includes but not limited to  sciatica, radicular pain, inguinal hernia ____________________________________________   LABS (all labs ordered are listed, but only abnormal results are displayed)  Labs Reviewed - No data to  display   __________________________________________  EKG   ____________________________________________  RADIOLOGY   ____________________________________________   PROCEDURES  Procedure(s) performed: no  Procedures  Critical Care performed: no  Observation: no ____________________________________________   INITIAL IMPRESSION / ASSESSMENT AND PLAN / ED COURSE  Pertinent labs & imaging results that were available during my care of the patient were reviewed by me and considered in my medical decision making (see chart for details).  The patient arrives neurologically intact although with what sounds to me like radicular back pain and not truly sciatica. He also has a left inguinal hernia that is easily reduced.    The patient's pain is somewhat improved after NSAIDs although not completely. He drove here and has no ride home some unable to provide an opiate analgesia today which she understands. I will prescribe him a short course is outpatient refer him to neurosurgery for likely MRI of his back. His hernia is easily reduced and has no ischemic changes. I will refer him to general surgery as an outpatient. He patient verbalizes understanding and agreement with the plan.  ____________________________________________   FINAL CLINICAL  IMPRESSION(S) / ED DIAGNOSES  Final diagnoses:  Radicular pain  Unilateral recurrent inguinal hernia without obstruction or gangrene      NEW MEDICATIONS STARTED DURING THIS VISIT:  Discharge Medication List as of 02/05/2017  8:38 PM    START taking these medications   Details  HYDROcodone-acetaminophen (NORCO) 5-325 MG tablet Take 1 tablet by mouth every 6 (six) hours as needed for severe pain., Starting Sun 02/05/2017, Print         Note:  This document was prepared using Dragon voice recognition software and may include unintentional dictation errors.     Merrily Brittle, MD 02/05/17 2153

## 2017-02-05 NOTE — Discharge Instructions (Signed)
Regarding your low back pain, please make an appointment to follow-up with your primary care physician this coming week for reevaluation and further pain management. Also please make an appointment to establish care with the neurosurgeon for further evaluation.  Regarding your left inguinal hernia please make an nonemergent appointment to follow-up with the general surgeon for evaluation. Return to the emergency department sooner for any new or worsening symptoms such as worsening pain, if you cannot eat or drink, or for any other concerns whatsoever.  It was a pleasure to take care of you today, and thank you for coming to our emergency department.  If you have any questions or concerns before leaving please ask the nurse to grab me and I'm more than happy to go through your aftercare instructions again.  If you were prescribed any opioid pain medication today such as Norco, Vicodin, Percocet, morphine, hydrocodone, or oxycodone please make sure you do not drive when you are taking this medication as it can alter your ability to drive safely.  If you have any concerns once you are home that you are not improving or are in fact getting worse before you can make it to your follow-up appointment, please do not hesitate to call 911 and come back for further evaluation.  Merrily Brittle, MD  Results for orders placed or performed during the hospital encounter of 01/08/17  Urinalysis, Complete w Microscopic  Result Value Ref Range   Color, Urine YELLOW (A) YELLOW   APPearance CLEAR (A) CLEAR   Specific Gravity, Urine 1.011 1.005 - 1.030   pH 7.0 5.0 - 8.0   Glucose, UA NEGATIVE NEGATIVE mg/dL   Hgb urine dipstick NEGATIVE NEGATIVE   Bilirubin Urine NEGATIVE NEGATIVE   Ketones, ur NEGATIVE NEGATIVE mg/dL   Protein, ur NEGATIVE NEGATIVE mg/dL   Nitrite NEGATIVE NEGATIVE   Leukocytes, UA NEGATIVE NEGATIVE   RBC / HPF 0-5 0 - 5 RBC/hpf   WBC, UA 0-5 0 - 5 WBC/hpf   Bacteria, UA NONE SEEN NONE SEEN     Squamous Epithelial / LPF NONE SEEN NONE SEEN   Mucus PRESENT    Dg Lumbar Spine 2-3 Views  Result Date: 01/08/2017 CLINICAL DATA:  Low back and left lower extremity pain for 3 days. No reported injury. EXAM: LUMBAR SPINE - 2-3 VIEW COMPARISON:  03/24/2015 CT abdomen/ pelvis. FINDINGS: This report assumes 5 non rib-bearing lumbar vertebrae. Lumbar vertebral body heights are preserved, with no fracture. Moderate to severe multilevel degenerative disc disease in the visualized thoracolumbar spine, most prominent at T12-L1, L1-2 and L5-S1, not appreciably changed. Stable 4 mm retrolisthesis at L2-3, 3 mm retrolisthesis at L3-4 and 2 mm retrolisthesis at L4-5. No new spondylolisthesis. Moderate facet arthropathy bilaterally in the lower lumbar spine. No aggressive appearing focal osseous lesions. Atherosclerotic abdominal aorta. IMPRESSION: 1. No lumbar spine fracture or acute malalignment. 2. Moderate to severe multilevel degenerative disc disease, facet arthropathy and mild multilevel spondylolisthesis, not appreciably changed. Electronically Signed   By: Delbert Phenix M.D.   On: 01/08/2017 18:15

## 2017-02-07 ENCOUNTER — Telehealth: Payer: Self-pay | Admitting: General Practice

## 2017-02-07 NOTE — Telephone Encounter (Signed)
Patients coming in on 10/5

## 2017-02-07 NOTE — Telephone Encounter (Signed)
Left message for patient to call the office to schedule an appointment from when he was seen in the ED, patient needs a ED Follow-up (02/05/17): Left Inguinal Hernia and can be placed on  schedule for next available surgeon. Please schedule if patient calls.

## 2017-02-10 ENCOUNTER — Encounter: Payer: Self-pay | Admitting: General Surgery

## 2017-02-10 ENCOUNTER — Ambulatory Visit (INDEPENDENT_AMBULATORY_CARE_PROVIDER_SITE_OTHER): Payer: Medicare Other | Admitting: General Surgery

## 2017-02-10 VITALS — BP 120/70 | HR 90 | Temp 97.5°F | Ht 68.0 in | Wt 142.6 lb

## 2017-02-10 DIAGNOSIS — K4090 Unilateral inguinal hernia, without obstruction or gangrene, not specified as recurrent: Secondary | ICD-10-CM | POA: Diagnosis not present

## 2017-02-10 NOTE — Progress Notes (Signed)
Patient ID: Christopher Parrish, male   DOB: [birth year], 81 y.o.   MRN: [id number]  CC: Left groin bulge  HPI Christopher Parrish is a 81 y.o. male presents to clinic for evaluation of a left groin bulge. Patient reports he was recently seen in the emergency room and was told he had a left inguinal hernia. He was primarily seen emergency room for pain to his back and was diagnosed with a pinched nerve. He states that his primary problem. He notices the bulge daily especially when standing but it does not cause pain and always goes back in. He denies any fevers, chills, nausea, vomiting, chest pain, shortness of breath, diarrhea, constipation.  HPI  Past Medical History:  Diagnosis Date  . Adult hypothyroidism 06/15/2012  . Arteriosclerosis of coronary artery 06/11/2012  . 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 BP (high blood pressure) 06/11/2012  . Diabetes mellitus (HCC) 06/15/2012  . 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 H/O coronary artery bypass surgery 06/15/2012   Overview:  MIDCAB x1 (partical thoracotomy), LIMA-LAD   . Hemorrhagic cerebrovascular accident (CVA) (HCC) 04/26/2013  . HLD (hyperlipidemia) 06/11/2012  . Intracranial hemorrhage (HCC) 04/25/2013  . Nephrolithiasis 03/17/2015  . Right lower quadrant pain 03/17/2015    Past Surgical History:  Procedure Laterality Date  . CARDIAC SURGERY  2014   bypass    Family History  Problem Relation Age of Onset  . Diabetes Brother   . Cancer Neg Hx     Social History Social History  Substance Use Topics  . Smoking status: Former Smoker    Quit date: 05/10/1987  . Smokeless tobacco: Never Used  . Alcohol use No    No Known Allergies  Current Outpatient Prescriptions  Medication Sig Dispense Refill  . aspirin EC 81 MG tablet Take by mouth.    . diphenhydrAMINE (BENADRYL) 25 MG tablet Take 25 mg by mouth every 6 (six) hours as needed.    Marland Kitchen HYDROcodone-acetaminophen (NORCO) 5-325 MG tablet Take 1 tablet by mouth every 6 (six)  hours as needed for severe pain. 15 tablet 0  . levothyroxine (SYNTHROID, LEVOTHROID) 100 MCG tablet Take 100 mcg by mouth daily before breakfast.    . lisinopril-hydrochlorothiazide (PRINZIDE,ZESTORETIC) 10-12.5 MG tablet Take 1 tablet by mouth daily.    Marland Kitchen loratadine (CLARITIN) 10 MG tablet Take by mouth.    Marland Kitchen omeprazole (PRILOSEC) 20 MG capsule Take by mouth.    . cyclobenzaprine (FLEXERIL) 5 MG tablet Take 1 tablet (5 mg total) by mouth 3 (three) times daily as needed. (Patient not taking: Reported on 02/10/2017) 20 tablet 0   No current facility-administered medications for this visit.      Review of Systems A Multi-point review of systems was asked and was negative except for the findings documented in the history of present illness  Physical Exam Blood pressure 120/70, pulse 90, temperature (!) 97.5 F (36.4 C), temperature source Oral, height  (1.727 m), weight 64.7 kg (142 lb 9.6 oz). CONSTITUTIONAL: No acute distress. EYES: Pupils are equal, round, and reactive to light, Sclera are non-icteric. EARS, NOSE, MOUTH AND THROAT: The oropharynx is clear. The oral mucosa is pink and moist. Hearing is intact to voice. LYMPH NODES:  Lymph nodes in the neck are normal. RESPIRATORY:  Lungs are clear. There is normal respiratory effort, with equal breath sounds bilaterally, and without pathologic use of accessory muscles. CARDIOVASCULAR: Heart is regular without murmurs, gallops, or rubs. GI: The abdomen is soft,  nontender, and nondistended. There is a readily visible left groin bulge that is easily reducible. There is no hepatosplenomegaly. There are normal bowel sounds in all quadrants. GU: Rectal deferred.   MUSCULOSKELETAL: Normal muscle strength and tone. No cyanosis or edema.   SKIN: Turgor is good and there are no pathologic skin lesions or ulcers. NEUROLOGIC: Motor and sensation is grossly normal. Cranial nerves are grossly intact. PSYCH:  Oriented to person, place and time.  Affect is normal.  Data Reviewed There are no images and labs reviewed for this encounter I have personally reviewed the patient's imaging, laboratory findings and medical records.    Assessment    Left inguinal hernia    Plan    81 year old male with an easily reducible left inguinal hernia. Discussed the problem in detail with the patient as well as the treatment options of open versus laparoscopic hernia repair. Patient reports at this time he is not interested in surgery as he wants his pinched nerve in his back to recover more. He states that he understands the risk of incarceration or circulation and will report to the emergency room if they occur. Otherwise he states he'll get a hernia belt and will call the clinic when he is ready to have his hernia addressed in the future.     Time spent with the patient was 30 minutes, with more than 50% of the time spent in face-to-face education, counseling and care coordination.     Ricarda Frame, MD FACS General Surgeon 02/10/2017, 12:08 PM

## 2017-02-10 NOTE — Patient Instructions (Signed)
We have seen you today for an Inguinal Hernia. Please call our office when you decide to move forward with planning your surgery.   You may get a "Hernia Truss" at a Medical Supply Store. A hernia truss will provide some counter pressure against your hernia and help control your pain and give you more comfort in that area.  A few in our area are:  [facility] [address] [place], [place] [phone]  [facility] [address], [place], [place] [phone]  [facility] [address] [place], [place] [phone]  If the hernia is causing more pain and has a firm bulge in this area, lie down and try to push the protruding part in slowly and gently. It is much easier to get this to go back in, when you are lying flat.  Highlighted below are the urgent symptoms with a Strangulated hernia. This is a medical emergency and if you experience any of these symptoms, go immediately to the emergency department.    Inguinal Hernia, Adult An inguinal hernia is when fat or the intestines push through the area where the leg meets the lower abdomen (groin) and create a rounded lump (bulge). This condition develops over time. There are three types of inguinal hernias. These types include:  Hernias that can be pushed back into the belly (are reducible).  Hernias that are not reducible (are incarcerated).  Hernias that are not reducible and lose their blood supply (are strangulated). This type of hernia requires emergency surgery.  What are the causes? This condition is caused by having a weak spot in the muscles or tissue. This weakness lets the hernia poke through. This condition can be triggered by:  Suddenly straining the muscles of the lower abdomen.  Lifting heavy objects.  Straining to have a bowel movement. Difficult bowel movements (constipation) can lead to this.  Coughing.  What increases the risk? This  condition is more likely to develop in:  Men.  Pregnant women.  People who: ? Are overweight. ? Work in jobs that require long periods of standing or heavy lifting. ? Have had an inguinal hernia before. ? Smoke or have lung disease. These factors can lead to long-lasting (chronic) coughing.  What are the signs or symptoms? Symptoms can depend on the size of the hernia. Often, a small inguinal hernia has no symptoms. Symptoms of a larger hernia include:  A lump in the groin. This is easier to see when the person is standing. It might not be visible when he or she is lying down.  Pain or burning in the groin. This occurs especially when lifting, straining, or coughing.  A dull ache or a feeling of pressure in the groin.  A lump in the scrotum in men.  Symptoms of a strangulated inguinal hernia can include:  A bulge in the groin that is very painful and tender to the touch.  A bulge that turns red or purple.  Fever, nausea, and vomiting.  The inability to have a bowel movement or to pass gas.  How is this diagnosed? This condition is diagnosed with a medical history and physical exam. Your health care provider may feel your groin area and ask you to cough. How is this treated? Treatment for this condition varies depending on the size of your hernia and whether you have symptoms. If you do not have symptoms, your health care provider may have you watch your hernia carefully and come in for follow-up  visits. If your hernia is larger or if you have symptoms, your treatment will include surgery. Follow these instructions at home: Lifestyle  Drink enough fluid to keep your urine clear or pale yellow.  Eat a diet that includes a lot of fiber. Eat plenty of fruits, vegetables, and whole grains. Talk with your health care provider if you have questions.  Avoid lifting heavy objects.  Avoid standing for long periods of time.  Do not use tobacco products, including cigarettes,  chewing tobacco, or e-cigarettes. If you need help quitting, ask your health care provider.  Maintain a healthy weight. General instructions  Do not try to force the hernia back in.  Watch your hernia for any changes in color or size. Let your health care provider know if any changes occur.  Take over-the-counter and prescription medicines only as told by your health care provider.  Keep all follow-up visits as told by your health care provider. This is important. Contact a health care provider if:  You have a fever.  You have new symptoms.  Your symptoms get worse. Get help right away if:  You have pain in the groin that suddenly gets worse.  A bulge in the groin gets bigger suddenly and does not go down.  You are a man and you have a sudden pain in the scrotum, or the size of your scrotum suddenly changes.  A bulge in the groin area becomes red or purple and is painful to the touch.  You have nausea or vomiting that does not go away.  You feel your heart beating a lot more quickly than normal.  You cannot have a bowel movement or pass gas. This information is not intended to replace advice given to you by your health care provider. Make sure you discuss any questions you have with your health care provider. Document Released: 09/11/2008 Document Revised: 10/01/2015 Document Reviewed: 03/05/2014 Elsevier Interactive Patient Education  2018 [employer].

## 2017-02-22 ENCOUNTER — Other Ambulatory Visit: Payer: Self-pay | Admitting: Student

## 2017-02-22 DIAGNOSIS — R200 Anesthesia of skin: Secondary | ICD-10-CM

## 2017-03-01 ENCOUNTER — Ambulatory Visit
Admission: RE | Admit: 2017-03-01 | Discharge: 2017-03-01 | Disposition: A | Discharge status: Home / Self Care | Payer: Medicare Other | Source: Ambulatory Visit | Attending: Student | Admitting: Student

## 2017-03-01 DIAGNOSIS — M5126 Other intervertebral disc displacement, lumbar region: Secondary | ICD-10-CM | POA: Diagnosis not present

## 2017-03-01 DIAGNOSIS — M47816 Spondylosis without myelopathy or radiculopathy, lumbar region: Secondary | ICD-10-CM | POA: Insufficient documentation

## 2017-03-01 DIAGNOSIS — Z981 Arthrodesis status: Secondary | ICD-10-CM | POA: Insufficient documentation

## 2017-03-01 DIAGNOSIS — R200 Anesthesia of skin: Secondary | ICD-10-CM | POA: Insufficient documentation

## 2019-07-11 ENCOUNTER — Other Ambulatory Visit: Payer: Self-pay

## 2019-07-11 ENCOUNTER — Ambulatory Visit: Payer: Medicare Other | Attending: Internal Medicine

## 2019-07-11 DIAGNOSIS — Z23 Encounter for immunization: Secondary | ICD-10-CM | POA: Insufficient documentation

## 2019-07-11 NOTE — Progress Notes (Signed)
   Covid-19 Vaccination Clinic  Name:  TROYCE GIESKE    MRN: [id number] DOB: 1935/11/28  07/11/2019  Mr. Lipscomb was observed post Covid-19 immunization for 15 minutes without incident. He was provided with Vaccine Information Sheet and instruction to access the V-Safe system.   Mr. Caseres was instructed to call 911 with any severe reactions post vaccine: Marland Kitchen Difficulty breathing  . Swelling of face and throat  . A fast heartbeat  . A bad rash all over body  . Dizziness and weakness   Immunizations Administered    Name Date Dose VIS Date Route   Pfizer COVID-19 Vaccine 07/11/2019  8:40 AM 0.3 mL 04/19/2019 Intramuscular   Manufacturer: [employer], [employer]   Lot: [id number]   NDC: [address]-3

## 2019-08-01 ENCOUNTER — Ambulatory Visit: Payer: Medicare Other | Attending: Internal Medicine

## 2019-08-01 ENCOUNTER — Other Ambulatory Visit: Payer: Self-pay

## 2019-08-01 NOTE — Progress Notes (Signed)
   Covid-19 Vaccination Clinic  Name:  Christopher Parrish    MRN: [id number] DOB: 08/17/1935  08/01/2019  [name] was observed post Covid-19 immunization for 15 minutes without incident. He was provided with Vaccine Information Sheet and instruction to access the V-Safe system.   [name] was instructed to call 911 with any severe reactions post vaccine: Marland Kitchen Difficulty breathing  . Swelling of face and throat  . A fast heartbeat  . A bad rash all over body  . Dizziness and weakness   Immunizations Administered    Name Date Dose VIS Date Route   Pfizer COVID-19 Vaccine 08/01/2019  8:21 AM 0.3 mL 04/19/2019 Intramuscular   Manufacturer: [employer], [employer]   Lot: [id number]   NDC: [address]-8

## 2019-10-08 ENCOUNTER — Emergency Department: Payer: Medicare Other

## 2019-10-08 ENCOUNTER — Other Ambulatory Visit: Payer: Self-pay

## 2019-10-08 DIAGNOSIS — R1010 Upper abdominal pain, unspecified: Secondary | ICD-10-CM | POA: Diagnosis not present

## 2019-10-08 DIAGNOSIS — Z79899 Other long term (current) drug therapy: Secondary | ICD-10-CM | POA: Diagnosis not present

## 2019-10-08 DIAGNOSIS — Z7984 Long term (current) use of oral hypoglycemic drugs: Secondary | ICD-10-CM | POA: Insufficient documentation

## 2019-10-08 DIAGNOSIS — R0789 Other chest pain: Secondary | ICD-10-CM | POA: Diagnosis not present

## 2019-10-08 DIAGNOSIS — I10 Essential (primary) hypertension: Secondary | ICD-10-CM | POA: Insufficient documentation

## 2019-10-08 DIAGNOSIS — E119 Type 2 diabetes mellitus without complications: Secondary | ICD-10-CM | POA: Insufficient documentation

## 2019-10-08 LAB — BASIC METABOLIC PANEL
Anion gap: 10 (ref 5–15)
BUN: 20 mg/dL (ref 8–23)
CO2: 24 mmol/L (ref 22–32)
Calcium: 9.6 mg/dL (ref 8.9–10.3)
Chloride: 104 mmol/L (ref 98–111)
Creatinine, Ser: 1.04 mg/dL (ref 0.61–1.24)
GFR calc Af Amer: >60 mL/min (ref >60)
GFR calc non Af Amer: >60 mL/min (ref >60)
Glucose, Bld: 112 mg/dL — ABNORMAL HIGH (ref 70–99)
Potassium: 3.8 mmol/L (ref 3.5–5.1)
Sodium: 138 mmol/L (ref 135–145)

## 2019-10-08 LAB — CBC
HCT: 39.5 % (ref 39.0–52.0)
Hemoglobin: 13.6 g/dL (ref 13.0–17.0)
MCH: 29.2 pg (ref 26.0–34.0)
MCHC: 34.4 g/dL (ref 30.0–36.0)
MCV: 84.9 fL (ref 80.0–100.0)
Platelets: 184 10*3/uL (ref 150–400)
RBC: 4.65 MIL/uL (ref 4.22–5.81)
RDW: 13.4 % (ref 11.5–15.5)
WBC: 6.2 10*3/uL (ref 4.0–10.5)
nRBC: 0 % (ref 0.0–0.2)

## 2019-10-08 LAB — TROPONIN I (HIGH SENSITIVITY): Troponin I (High Sensitivity): 7 ng/L (ref <18)

## 2019-10-08 MED ORDER — SODIUM CHLORIDE 0.9% FLUSH: 3.0000 mL | Freq: Once | INTRAVENOUS | Status: DC

## 2019-10-08 NOTE — ED Provider Notes (Signed)
MSE was initiated and I personally evaluated the patient and placed orders (if any) at  5:36 PM on October 08, 2019.   CC: chest pain  S: Describes central chest pain, which is currently resolved. Now describes upper abdominal pain, described as cramping, which was worse while straining to use the bathroom. He notes a mild cough, but denies SOB, hemoptysis, N/V, or weakness.   O: A&O NAD CVS: sinus rhythm,  [place]: CTA. No rales, rhonchi, wheeze ABD: soft, nontender. Normal bowel sounds. Skin: warm & dry  EKG: sinus rhythm 82 bpm           1st degree AV block           PVCs w/ bigeminy           RBBB  A/P: chest pain & epigastric pain  Initial labs and imaging pending. The patient appears stable so that the remainder of the MSE may be completed by another provider.   Lissa Hoard, PA-C 10/08/19 1745    Emily Filbert, MD 10/08/19 2111

## 2019-10-08 NOTE — ED Triage Notes (Signed)
Pt arrived to ER for chest pain (resolved at this time). Now c/o of upper abd pain.  Denies N&V&D. States "lil cough but nothing unusual." denies SOB. States pain began when straining to use bathroom earlier today.  A&O, in wheelchair.

## 2019-10-08 NOTE — ED Triage Notes (Signed)
First nurse note- here for chest pain. Pulled next for ekg

## 2019-10-09 ENCOUNTER — Emergency Department
Admission: EM | Admit: 2019-10-09 | Discharge: 2019-10-09 | Disposition: A | Discharge status: Home / Self Care | Payer: Medicare Other | Attending: Emergency Medicine | Admitting: Emergency Medicine

## 2019-10-09 ENCOUNTER — Emergency Department: Payer: Medicare Other

## 2019-10-09 DIAGNOSIS — R079 Chest pain, unspecified: Secondary | ICD-10-CM

## 2019-10-09 DIAGNOSIS — R109 Unspecified abdominal pain: Secondary | ICD-10-CM

## 2019-10-09 LAB — TROPONIN I (HIGH SENSITIVITY): Troponin I (High Sensitivity): 9 ng/L (ref <18)

## 2019-10-09 MED ORDER — IOHEXOL 350 MG/ML SOLN
125.0000 mL | Freq: Once | INTRAVENOUS | Status: AC | PRN
  Administered 2019-10-09: 125 mL via INTRAVENOUS

## 2019-10-09 NOTE — ED Notes (Signed)
ED Provider at bedside. 

## 2019-10-09 NOTE — ED Provider Notes (Signed)
[facility] Emergency Department Provider Note  ____________________________________________   First MD Initiated Contact with Patient 10/09/19 0116     (approximate)  I have reviewed the triage vital signs and the nursing notes.   HISTORY  Chief Complaint Chest Pain and Abdominal Pain    HPI Christopher Parrish is a 84 y.o. 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  Patient states that chest pain is completely resolved at this time.  Patient states however that upper abdominal discomfort has persisted with current pain score of 4 out of 10.  Patient denies any dyspnea.  Patient denies any lower extremity pain or swelling.  Patient denies any lower extremity weakness or numbness.  Patient denies any nausea vomiting or diarrhea.  Patient denies any urinary symptoms.        Past Medical History:  Diagnosis Date  . Adult hypothyroidism 06/15/2012  . Arteriosclerosis of coronary artery 06/11/2012  . 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 BP (high blood pressure) 06/11/2012  . Diabetes mellitus (HCC) 06/15/2012  . 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 H/O coronary artery bypass surgery 06/15/2012   Overview:  MIDCAB x1 (partical thoracotomy), LIMA-LAD   . Hemorrhagic cerebrovascular accident (CVA) (HCC) 04/26/2013  . HLD (hyperlipidemia) 06/11/2012  . Intracranial hemorrhage (HCC) 04/25/2013  . Nephrolithiasis 03/17/2015  . Right lower quadrant pain 03/17/2015    Patient Active Problem List   Diagnosis Date Noted  . 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 Right lower quadrant pain 03/17/2015  . Nephrolithiasis 03/17/2015  .  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 Hemorrhagic cerebrovascular accident (CVA) (HCC) 04/26/2013  . Intracranial hemorrhage (HCC) 04/25/2013  . Diabetes mellitus (HCC) 06/15/2012  . Adult hypothyroidism 06/15/2012  . H/O coronary artery bypass surgery 06/15/2012  . Arteriosclerosis of coronary artery 06/11/2012  . HLD (hyperlipidemia) 06/11/2012  . BP (high blood pressure) 06/11/2012    Past Surgical History:  Procedure Laterality Date  . CARDIAC SURGERY  2014   bypass    Prior to Admission medications   Medication Sig Start Date End Date Taking? Authorizing Provider  aspirin EC 81 MG tablet Take by mouth.    [provider]  cyclobenzaprine (FLEXERIL) 5 MG tablet Take 1 tablet (5 mg total) by mouth 3 (three) times daily as needed. Patient not taking: Reported on 02/10/2017 01/08/17   Little, Traci M, PA-C  diphenhydrAMINE (BENADRYL) 25 MG tablet Take 25 mg by mouth every 6 (six) hours as needed.    [provider]  HYDROcodone-acetaminophen (NORCO) 5-325 MG tablet Take 1 tablet by mouth every 6 (six) hours as needed for severe pain. 02/05/17   Merrily Brittle, MD  levothyroxine (SYNTHROID, LEVOTHROID) 100 MCG tablet Take 100 mcg by mouth daily before breakfast.    [provider]  lisinopril-hydrochlorothiazide (PRINZIDE,ZESTORETIC) 10-12.5 MG tablet Take 1 tablet by mouth daily.    [provider]  loratadine (CLARITIN) 10 MG tablet Take by mouth.    [provider]  omeprazole (PRILOSEC) 20 MG capsule Take by mouth.    [provider]    Allergies Patient has no known allergies.  Family History  Problem Relation Age of Onset  . Diabetes Brother   . Cancer Neg  Hx     Social History Social History   Tobacco Use  . Smoking status: Former Smoker    Quit date: 05/10/1987    Years since quitting: 32.4  . Smokeless tobacco: Never Used  Substance Use Topics  . Alcohol use: No    Alcohol/week: 0.0 standard drinks  . Drug use: No      Review of Systems Constitutional: No fever/chills Eyes: No visual changes. ENT: No sore throat. Cardiovascular: Positive for chest pain. Respiratory: Denies shortness of breath. Gastrointestinal: Positive for abdominal pain.  No nausea, no vomiting.  No diarrhea.  No constipation. Genitourinary: Negative for dysuria. Musculoskeletal: Negative for neck pain.  Negative for back pain. Integumentary: Negative for rash. Neurological: Negative for headaches, focal weakness or numbness.  ____________________________________________   PHYSICAL EXAM:  VITAL SIGNS: ED Triage Vitals  Enc Vitals Group     BP 10/08/19 1737 (!) 165/60     Pulse Rate 10/08/19 1737 74     Resp 10/08/19 1737 16     Temp 10/08/19 1737 98.3 F (36.8 C)     Temp Source 10/08/19 1737 Oral     SpO2 10/08/19 1737 98 %     Weight 10/08/19 1738 70.3 kg (155 lb)     Height 10/08/19 1738 1.727 m (5\' 8" )     Head Circumference --      Peak Flow --      Pain Score 10/08/19 1737 0     Pain Loc --      Pain Edu? --      Excl. in GC? --     Constitutional: Alert and oriented.  Eyes: Conjunctivae are normal.  Mouth/Throat: Patient is wearing a mask. Neck: No stridor.  No meningeal signs.   Cardiovascular: Normal rate, regular rhythm. Good peripheral circulation. Grossly normal heart sounds. Respiratory: Normal respiratory effort.  No retractions. Gastrointestinal: Soft and nontender. No distention.  Musculoskeletal: No lower extremity tenderness nor edema. No gross deformities of extremities. Neurologic:  Normal speech and language. No gross focal neurologic deficits are appreciated.  Skin:  Skin is warm, dry and intact. Psychiatric: Mood and affect are normal. Speech and behavior are normal.  ____________________________________________   LABS (all labs ordered are listed, but only abnormal results are displayed)  Labs Reviewed  BASIC METABOLIC PANEL - Abnormal; Notable for the following components:       Result Value   Glucose, Bld 112 (*)    All other components within normal limits  CBC  TROPONIN I (HIGH SENSITIVITY)  TROPONIN I (HIGH SENSITIVITY)   ____________________________________________  EKG  ED ECG REPORT I, [place] Dayona Shaheen, the attending physician, personally viewed and interpreted this ECG.   Date: 10/08/2019  EKG Time: 4:40 PM  Rate: 82  Rhythm: Normal sinus rhythm with  premature ventricular contraction, right bundle branch block  Axis: Normal  Intervals: Normal  ST&T Change: None  ____________________________________________  RADIOLOGY I, [place] Aryan Bello, personally viewed and evaluated these images (plain radiographs) as part of my medical decision making, as well as reviewing the written report by the radiologist.  ED MD interpretation: No active cardiopulmonary disease noted on chest x-ray.  CT a of chest abdomen pelvis revealed no evidence of aortic dissection or aneurysmal dilatation no pulmonary emboli no acute abnormality seen per radiologist  Official radiology report(s): DG Chest 2 View  Result Date: 10/08/2019 CLINICAL DATA:  84 year old male with chest pain. EXAM: CHEST - 2 VIEW COMPARISON:  None. FINDINGS: The lungs are clear. There is  no pleural effusion pneumothorax. The cardiac silhouette is within limits. No acute osseous pathology. Atherosclerotic calcification of the aortic arch. IMPRESSION: No active cardiopulmonary disease. Electronically Signed   By: Anner Crete M.D.   On: 10/08/2019 18:10   CT Angio Chest/Abd/Pel for Dissection W and/or Wo Contrast  Result Date: 10/09/2019 CLINICAL DATA:  Chest and abdominal pain EXAM: CT ANGIOGRAPHY CHEST, ABDOMEN AND PELVIS TECHNIQUE: Non-contrast CT of the chest was initially obtained. Multidetector CT imaging through the chest, abdomen and pelvis was performed using the standard protocol during bolus administration of intravenous contrast. Multiplanar reconstructed images and MIPs were obtained and  reviewed to evaluate the vascular anatomy. CONTRAST:  125 mL Omnipaque 350. COMPARISON:  Chest x-ray from the previous day, CT from 03/24/2015. FINDINGS: CTA CHEST FINDINGS Cardiovascular: Initial precontrast imaging of the thoracic aorta was performed and demonstrated no definitive hyperdense crescent to suggest acute aortic injury. Subsequently contrast was administered. The thoracic aorta demonstrates atherosclerotic calcification without aneurysmal dilatation or dissection. No cardiac enlargement is seen. The coronary arteries demonstrate mild atherosclerotic calcification. Pulmonary artery shows a normal enhancement pattern. No findings to suggest pulmonary emboli are seen. Mediastinum/Nodes: Thoracic inlet is within normal limits. No hilar or mediastinal adenopathy is noted. The esophagus as visualized is within normal limits. Lungs/Pleura: Lungs are well aerated bilaterally. No focal infiltrate or sizable effusion is seen. No sizable parenchymal nodules are noted. No pneumothorax is identified. Musculoskeletal: Degenerative changes of the thoracic spine are noted. No rib abnormality is seen. Review of the MIP images confirms the above findings. 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 When compared with the prior exam this is stable in appearance. Scattered atherosclerotic calcification is noted. Tortuosity of the abdominal aorta is seen with mild dilatation up to 2.7 cm. Celiac: Patent without evidence of aneurysm, dissection, vasculitis or significant stenosis. SMA: Patent without evidence of aneurysm, dissection, vasculitis or significant stenosis. Renals: Both renal arteries are patent without evidence of aneurysm, dissection, vasculitis, fibromuscular dysplasia or significant stenosis. IMA: Patent without evidence of aneurysm, dissection, vasculitis or significant stenosis. Iliacs:  Scattered atherosclerotic calcifications are noted without aneurysmal dilatation. Veins: No specific venous abnormality is noted. Review of the MIP images confirms the above findings. NON-VASCULAR Hepatobiliary: Gallbladder is partially distended. No cholelithiasis is seen. The liver demonstrates diffuse decreased attenuation consistent with fatty infiltration. Scattered calcified granulomas are seen. Pancreas: Within normal limits. Spleen: Normal in size without focal abnormality. Adrenals/Urinary Tract: Adrenal glands are unremarkable. Kidneys demonstrate a normal enhancement pattern bilaterally. Multiple cortical and parapelvic cysts are seen. Previously seen renal calculi are noted. There is a lower pole 7 mm stone identified on the right and lower pole 7-8 mm stone on the left stable from the prior exam. No obstructive changes are seen. The bladder is well distended. Stomach/Bowel: No obstructive or inflammatory changes of the colon are seen. The appendix is well visualized and within normal limits. The small bowel and stomach are unremarkable with the exception of a large duodenal diverticulum adjacent to the head of the pancreas. Lymphatic: No specific lymphadenopathy is noted. Reproductive: Prostate is enlarged in size indenting upon the inferior aspect of the bladder. Other: No free fluid is seen. Musculoskeletal: Degenerative changes of lumbar spine are noted. Review of the MIP images confirms the above findings. IMPRESSION: CTA of the chest: No evidence of aortic dissection or aneurysmal dilatation. No definitive pulmonary emboli are noted. No acute  abnormality is seen. 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 Mild dilatation of the infrarenal aorta to 2.7 cm is noted. Bilateral nonobstructing renal calculi. No acute abnormality is identified to correspond with the patient's clinical history. Critical Value/emergent results were called by telephone  at the time of interpretation on 10/09/2019 at 2:35 am to Dr. Manson Passey, who verbally acknowledged these results. Electronically Signed   By: Alcide Clever M.D.   On: 10/09/2019 02:37     Procedures   ____________________________________________   INITIAL IMPRESSION / MDM / ASSESSMENT AND PLAN / ED COURSE  As part of my medical decision making, I reviewed the following data within the electronic MEDICAL RECORD NUMBER 84 year old male presented with above-stated history and physical exam with differential diagnosis including but not limited to ACS, AAA, dissection, pulmonary emboli.  EKG revealed no evidence of ischemia or infarction laboratory data including high-sensitivity troponin negative x2.  CT angiogram chest abdomen pelvis revealed no evidence of pulmonary emboli or aortic dissection or any other acute pathology.  Patient pain-free at present without any complaint.  Patient will be discharged home with recommendation for outpatient follow-up. ____________________________________________  FINAL CLINICAL IMPRESSION(S) / ED DIAGNOSES  Final diagnoses:  Chest pain, unspecified type  Abdominal pain, unspecified abdominal location     MEDICATIONS GIVEN DURING THIS VISIT:  Medications  sodium chloride flush (NS) 0.9 % injection 3 mL (3 mLs Intravenous Not Given 10/08/19 1759)  iohexol (OMNIPAQUE) 350 MG/ML injection 125 mL (125 mLs Intravenous Contrast Given 10/09/19 0130)     ED Discharge Orders    None      *Please note:  Robie W Menning was evaluated in Emergency Department on 10/09/2019 for the symptoms described in the history of present illness. He was evaluated in the context of the global COVID-19 pandemic, which necessitated consideration that the patient might be at risk for infection with the SARS-CoV-2 virus that causes COVID-19. Institutional protocols and algorithms that pertain to the evaluation of patients at risk for COVID-19 are in a state of rapid change based on information  released by regulatory bodies including the CDC and federal and state organizations. These policies and algorithms were followed during the patient's care in the ED.  Some ED evaluations and interventions may be delayed as a result of limited staffing during the pandemic.*  Note:  This document was prepared using Dragon voice recognition software and may include unintentional dictation errors.   Darci Current, MD 10/11/19 [phone]

## 2020-01-16 ENCOUNTER — Encounter (INDEPENDENT_AMBULATORY_CARE_PROVIDER_SITE_OTHER): Payer: Self-pay | Admitting: Vascular Surgery

## 2020-03-16 ENCOUNTER — Encounter (INDEPENDENT_AMBULATORY_CARE_PROVIDER_SITE_OTHER): Payer: Self-pay | Admitting: Vascular Surgery

## 2020-04-06 ENCOUNTER — Encounter (INDEPENDENT_AMBULATORY_CARE_PROVIDER_SITE_OTHER): Payer: Self-pay | Admitting: Vascular Surgery

## 2020-04-06 ENCOUNTER — Ambulatory Visit (INDEPENDENT_AMBULATORY_CARE_PROVIDER_SITE_OTHER): Payer: Medicare Other | Admitting: Vascular Surgery

## 2020-04-06 ENCOUNTER — Other Ambulatory Visit: Payer: Self-pay

## 2020-04-06 VITALS — BP 163/83 | HR 73 | Resp 16 | Ht 65.0 in | Wt 157.4 lb

## 2020-04-06 DIAGNOSIS — I10 Essential (primary) hypertension: Secondary | ICD-10-CM

## 2020-04-06 DIAGNOSIS — I2510 Atherosclerotic heart disease of native coronary artery without angina pectoris: Secondary | ICD-10-CM

## 2020-04-06 DIAGNOSIS — I714 Abdominal aortic aneurysm, without rupture, unspecified: Secondary | ICD-10-CM

## 2020-04-06 DIAGNOSIS — Z794 Long term (current) use of insulin: Secondary | ICD-10-CM

## 2020-04-06 DIAGNOSIS — E119 Type 2 diabetes mellitus without complications: Secondary | ICD-10-CM | POA: Diagnosis not present

## 2020-04-06 NOTE — Progress Notes (Signed)
MRN : [id number]  Christopher Parrish is a 84 y.o. (20-Aug-1935) male who presents with chief complaint of  Chief Complaint  Patient presents with  . New Patient (Initial Visit)    ref Mellissa Kohut AAA  .  History of Present Illness:   The patient presents to the office for evaluation of an abdominal aortic aneurysm associated with a stable infrarenal aortic dissection. The aneurysm was found incidentally by CT scan. Patient denies abdominal pain or unusual back pain, no other abdominal complaints.  No history of an acute onset of painful blue discoloration of the toes.     No family history of AAA.   Patient denies amaurosis fugax or TIA symptoms. There is no history of claudication or rest pain symptoms of the lower extremities.  The patient denies angina or shortness of breath.  CT scan shows an AAA that measures 2.80 cm  Current Meds  Medication Sig  . aspirin EC 81 MG tablet Take by mouth.  . esomeprazole (NEXIUM) 20 MG capsule Take 20 mg by mouth daily at 12 noon.  Marland Kitchen levothyroxine (SYNTHROID, LEVOTHROID) 100 MCG tablet Take 100 mcg by mouth daily before breakfast.  . lisinopril-hydrochlorothiazide (PRINZIDE,ZESTORETIC) 10-12.5 MG tablet Take 1 tablet by mouth daily.  Marland Kitchen loratadine (CLARITIN) 10 MG tablet Take by mouth.    Past Medical History:  Diagnosis Date  . Adult hypothyroidism 06/15/2012  . Arteriosclerosis of coronary artery 06/11/2012  . 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 BP (high blood pressure) 06/11/2012  . Diabetes mellitus (HCC) 06/15/2012  . 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 H/O coronary artery bypass surgery 06/15/2012   Overview:  MIDCAB x1 (partical thoracotomy), LIMA-LAD   . Hemorrhagic cerebrovascular accident (CVA) (HCC) 04/26/2013  . HLD (hyperlipidemia) 06/11/2012  . Intracranial hemorrhage (HCC) 04/25/2013  . Nephrolithiasis 03/17/2015  . Right lower quadrant pain 03/17/2015    Past Surgical History:  Procedure Laterality Date  . CARDIAC SURGERY   2014   bypass    Social History Social History   Tobacco Use  . Smoking status: Former Smoker    Quit date: 05/10/1987    Years since quitting: 32.9  . Smokeless tobacco: Never Used  Vaping Use  . Vaping Use: Never used  Substance Use Topics  . Alcohol use: No    Alcohol/week: 0.0 standard drinks  . Drug use: No    Family History Family History  Problem Relation Age of Onset  . Diabetes Brother   . Cancer Neg Hx   No family history of bleeding/clotting disorders, porphyria or autoimmune disease   No Known Allergies   REVIEW OF SYSTEMS (Negative unless checked)  Constitutional: [] Weight loss  [] Fever  [] Chills Cardiac: [] Chest pain   [] Chest pressure   [] Palpitations   [] Shortness of breath when laying flat   [] Shortness of breath with exertion. Vascular:  [] Pain in legs with walking   [] Pain in legs at rest  [] History of DVT   [] Phlebitis   [] Swelling in legs   [] Varicose veins   [] Non-healing ulcers Pulmonary:   [] Uses home oxygen   [] Productive cough   [] Hemoptysis   [] Wheeze  [] COPD   [] Asthma Neurologic:  [] Dizziness   [] Seizures   [] History of stroke   [] History of TIA  [] Aphasia   [] Vissual changes   [] Weakness or numbness in arm   [] Weakness or numbness in leg Musculoskeletal:   [] Joint swelling   [] Joint pain   [] Low back pain Hematologic:  [] Easy bruising  []   Easy bleeding   [] Hypercoagulable state   [] Anemic Gastrointestinal:  [] Diarrhea   [] Vomiting  [] Gastroesophageal reflux/heartburn   [] Difficulty swallowing. Genitourinary:  [] Chronic kidney disease   [] Difficult urination  [] Frequent urination   [] Blood in urine Skin:  [] Rashes   [] Ulcers  Psychological:  [] History of anxiety   []  History of major depression.  Physical Examination  Vitals:   04/06/20 1540  BP: (!) 163/83  Pulse: 73  Resp: 16  Weight: 157 lb 6.4 oz (71.4 kg)  Height: 5\' 5"  (1.651 m)   Body mass index is 26.19 kg/m. Gen: WD/WN, NAD Head: [place]/AT, No temporalis wasting.    Ear/Nose/Throat: Hearing grossly intact, nares w/o erythema or drainage, poor dentition Eyes: PER, EOMI, sclera nonicteric.  Neck: Supple, no masses.  No bruit or JVD.  Pulmonary:  Good air movement, clear to auscultation bilaterally, no use of accessory muscles.  Cardiac: RRR, normal S1, S2, no Murmurs. Vascular: Popliteal pulses not enlarged Vessel Right Left  Radial Palpable Palpable  Popliteal Palpable Palpable  PT Palpable Palpable  DP Palpable Palpable  Gastrointestinal: soft, non-distended. No guarding/no peritoneal signs.  Musculoskeletal: M/S 5/5 throughout.  No deformity or atrophy.  Neurologic: CN 2-12 intact. Pain and light touch intact in extremities.  Symmetrical.  Speech is fluent. Motor exam as listed above. Psychiatric: Judgment intact, Mood & affect appropriate for pt's clinical situation. Dermatologic: No rashes or ulcers noted.  No changes consistent with cellulitis. Lymph : No Cervical lymphadenopathy, no lichenification or skin changes of chronic lymphedema.   CBC Lab Results  Component Value Date   WBC 6.2 10/08/2019   HGB 13.6 10/08/2019   HCT 39.5 10/08/2019   MCV 84.9 10/08/2019   PLT 184 10/08/2019    BMET    Component Value Date/Time   NA 138 10/08/2019 1740   NA 140 03/17/2015 1520   NA 142 04/25/2013 1650   K 3.8 10/08/2019 1740   K 3.5 04/25/2013 1650   CL 104 10/08/2019 1740   CL 107 04/25/2013 1650   CO2 24 10/08/2019 1740   CO2 27 04/25/2013 1650   GLUCOSE 112 (H) 10/08/2019 1740   GLUCOSE 130 (H) 04/25/2013 1650   BUN 20 10/08/2019 1740   BUN 15 03/17/2015 1520   BUN 18 04/25/2013 1650   CREATININE 1.04 10/08/2019 1740   CREATININE 1.02 04/25/2013 1650   CALCIUM 9.6 10/08/2019 1740   CALCIUM 9.9 04/25/2013 1650   GFRNONAA >60 10/08/2019 1740   GFRNONAA >60 04/25/2013 1650   GFRAA >60 10/08/2019 1740   GFRAA >60 04/25/2013 1650   CrCl cannot be calculated (Patient's most recent lab result is older than the maximum 21 days  allowed.).  COAG No results found for: INR, PROTIME  Radiology No results found.   Assessment/Plan 1. Abdominal aortic aneurysm (AAA) without rupture (HCC) No surgery or intervention at this time. The patient has an asymptomatic abdominal aortic aneurysm that is less than 4 cm in maximal diameter.  I have discussed the natural history of abdominal aortic aneurysm and the small risk of rupture for aneurysm less than 5 cm in size.  However, as these small aneurysms tend to enlarge over time, continued surveillance with ultrasound or CT scan is mandatory.  I have also discussed optimizing medical management with hypertension and lipid control and the importance of abstinence from tobacco.  The patient is also encouraged to exercise a minimum of 30 minutes 4 times a week.  Should the patient develop new onset abdominal or back pain or  signs of peripheral embolization they are instructed to seek medical attention immediately and to alert the physician providing care that they have an aneurysm.  The patient voices their understanding. I have scheduled the patient to return in 6 months with an aortic duplex. - VAS US AORTA/IVC/ILIACS; Future  2. Arteriosclerosis of coronary artery Continue cardiac and antihypertensive medications as already ordered and reviewed, no changes at this time.  Continue statin as ordered and reviewed, no changes at this time  Nitrates PRN for chest pain   3. Primary hypertension Continue antihypertensive medications as already ordered, these medications have been reviewed and there are no changes at this time.   4. Type 2 diabetes mellitus without complication, with long-term current use of insulin (HCC) Continue hypoglycemic medications as already ordered, these medications have been reviewed and there are no changes at this time.  Hgb A1C to be monitored as already arranged by primary service     Levora Dredge, MD  04/06/2020 3:45 PM

## 2020-04-07 ENCOUNTER — Encounter (INDEPENDENT_AMBULATORY_CARE_PROVIDER_SITE_OTHER): Payer: Self-pay | Admitting: Vascular Surgery

## 2020-04-07 DIAGNOSIS — I714 Abdominal aortic aneurysm, without rupture, unspecified: Secondary | ICD-10-CM | POA: Insufficient documentation

## 2020-07-20 ENCOUNTER — Other Ambulatory Visit: Payer: Self-pay

## 2020-07-20 ENCOUNTER — Emergency Department
Admission: EM | Admit: 2020-07-20 | Discharge: 2020-07-20 | Disposition: A | Discharge status: Home / Self Care | Payer: Medicare Other | Attending: Emergency Medicine | Admitting: Emergency Medicine

## 2020-07-20 DIAGNOSIS — Z87891 Personal history of nicotine dependence: Secondary | ICD-10-CM | POA: Diagnosis not present

## 2020-07-20 DIAGNOSIS — I2510 Atherosclerotic heart disease of native coronary artery without angina pectoris: Secondary | ICD-10-CM | POA: Diagnosis not present

## 2020-07-20 DIAGNOSIS — Z79899 Other long term (current) drug therapy: Secondary | ICD-10-CM | POA: Insufficient documentation

## 2020-07-20 DIAGNOSIS — I10 Essential (primary) hypertension: Secondary | ICD-10-CM | POA: Diagnosis not present

## 2020-07-20 DIAGNOSIS — Z951 Presence of aortocoronary bypass graft: Secondary | ICD-10-CM | POA: Insufficient documentation

## 2020-07-20 DIAGNOSIS — Z139 Encounter for screening, unspecified: Secondary | ICD-10-CM

## 2020-07-20 DIAGNOSIS — E039 Hypothyroidism, unspecified: Secondary | ICD-10-CM | POA: Diagnosis not present

## 2020-07-20 DIAGNOSIS — E1140 Type 2 diabetes mellitus with diabetic neuropathy, unspecified: Secondary | ICD-10-CM | POA: Insufficient documentation

## 2020-07-20 DIAGNOSIS — Z7982 Long term (current) use of aspirin: Secondary | ICD-10-CM | POA: Insufficient documentation

## 2020-07-20 LAB — CBC
HCT: 40.7 % (ref 39.0–52.0)
Hemoglobin: 13.4 g/dL (ref 13.0–17.0)
MCH: 28.8 pg (ref 26.0–34.0)
MCHC: 32.9 g/dL (ref 30.0–36.0)
MCV: 87.3 fL (ref 80.0–100.0)
Platelets: 191 10*3/uL (ref 150–400)
RBC: 4.66 MIL/uL (ref 4.22–5.81)
RDW: 13.2 % (ref 11.5–15.5)
WBC: 4.8 10*3/uL (ref 4.0–10.5)
nRBC: 0 % (ref 0.0–0.2)

## 2020-07-20 LAB — COMPREHENSIVE METABOLIC PANEL WITH GFR
ALT: 17 U/L (ref 0–44)
AST: 18 U/L (ref 15–41)
Albumin: 4.1 g/dL (ref 3.5–5.0)
Alkaline Phosphatase: 65 U/L (ref 38–126)
Anion gap: 6 (ref 5–15)
BUN: 18 mg/dL (ref 8–23)
CO2: 27 mmol/L (ref 22–32)
Calcium: 9.3 mg/dL (ref 8.9–10.3)
Chloride: 105 mmol/L (ref 98–111)
Creatinine, Ser: 1.17 mg/dL (ref 0.61–1.24)
GFR, Estimated: >60 mL/min
Glucose, Bld: 116 mg/dL — ABNORMAL HIGH (ref 70–99)
Potassium: 4.2 mmol/L (ref 3.5–5.1)
Sodium: 138 mmol/L (ref 135–145)
Total Bilirubin: 0.6 mg/dL (ref 0.3–1.2)
Total Protein: 6.9 g/dL (ref 6.5–8.1)

## 2020-07-20 NOTE — ED Triage Notes (Signed)
Pt c/o having numbness from his calves down yesterday and just not feeling well and checked his b/p states it was 170's/100.Marland Kitchen

## 2020-07-20 NOTE — ED Provider Notes (Signed)
[facility] Emergency Department Provider Note   ____________________________________________    I have reviewed the triage vital signs and the nursing notes.   HISTORY  Chief Complaint Numbness and Hypertension     HPI Christopher Parrish is a 85 y.o. male with a history of hypertension, diabetes, CAD, AAA who presents with complaints of high blood pressure.  Patient reports this morning he "was not feeling good "so he decided to check his blood pressure and found to be high.  He is not able to articulate what symptoms he was having.  He denies chest pain, no abdominal pain, no nausea or vomiting.  No dizziness.  No weakness.  He does report tingling in his legs bilaterally but notes that he has had that for years.  Denies back pain or injury.  Currently he states that "he feels fine ".  Past Medical History:  Diagnosis Date  . Adult hypothyroidism 06/15/2012  . Arteriosclerosis of coronary artery 06/11/2012  . 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 BP (high blood pressure) 06/11/2012  . Diabetes mellitus (HCC) 06/15/2012  . 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 H/O coronary artery bypass surgery 06/15/2012   Overview:  MIDCAB x1 (partical thoracotomy), LIMA-LAD   . Hemorrhagic cerebrovascular accident (CVA) (HCC) 04/26/2013  . HLD (hyperlipidemia) 06/11/2012  . Intracranial hemorrhage (HCC) 04/25/2013  . Nephrolithiasis 03/17/2015  . Right lower quadrant pain 03/17/2015    Patient Active Problem List   Diagnosis Date Noted  . AAA (abdominal aortic aneurysm) (HCC) 04/07/2020  . 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 Right lower quadrant pain 03/17/2015  . Nephrolithiasis 03/17/2015  . 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 Hemorrhagic cerebrovascular accident (CVA) (HCC) 04/26/2013  . Intracranial hemorrhage (HCC) 04/25/2013  . Diabetes mellitus (HCC) 06/15/2012  . Adult hypothyroidism 06/15/2012  . 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 Arteriosclerosis of coronary  artery 06/11/2012  . HLD (hyperlipidemia) 06/11/2012  . BP (high blood pressure) 06/11/2012    Past Surgical History:  Procedure Laterality Date  . CARDIAC SURGERY  2014   bypass    Prior to Admission medications   Medication Sig Start Date End Date Taking? Authorizing Provider  aspirin EC 81 MG tablet Take by mouth.    [provider]  cyclobenzaprine (FLEXERIL) 5 MG tablet Take 1 tablet (5 mg total) by mouth 3 (three) times daily as needed. Patient not taking: Reported on 02/10/2017 01/08/17   Little, Traci M, PA-C  diphenhydrAMINE (BENADRYL) 25 MG tablet Take 25 mg by mouth every 6 (six) hours as needed. Patient not taking: Reported on 04/06/2020    [provider]  esomeprazole (NEXIUM) 20 MG capsule Take 20 mg by mouth daily at 12 noon.    [provider]  HYDROcodone-acetaminophen (NORCO) 5-325 MG tablet Take 1 tablet by mouth every 6 (six) hours as needed for severe pain. Patient not taking: Reported on 04/06/2020 02/05/17   Merrily Brittle, MD  levothyroxine (SYNTHROID, LEVOTHROID) 100 MCG tablet Take 100 mcg by mouth daily before breakfast.    [provider]  lisinopril-hydrochlorothiazide (PRINZIDE,ZESTORETIC) 10-12.5 MG tablet Take 1 tablet by mouth daily.    [provider]  loratadine (CLARITIN) 10 MG tablet Take by mouth.    [provider]  omeprazole (PRILOSEC) 20 MG capsule Take by mouth. Patient not taking: Reported on 04/06/2020    [provider]     Allergies Patient has no known allergies.  Family History  Problem  Relation Age of Onset  . Diabetes Brother   . Cancer Neg Hx     Social History Social History   Tobacco Use  . Smoking status: Former Smoker    Quit date: 05/10/1987    Years since quitting: 33.2  . Smokeless tobacco: Never Used  Vaping Use  . Vaping Use: Never used  Substance Use Topics  . Alcohol use: No    Alcohol/week: 0.0 standard drinks  . Drug use: No    Review of  Systems  Constitutional: No fever/chills Eyes: No visual changes.  ENT: No sore throat. Cardiovascular: Denies chest pain. Respiratory: Denies shortness of breath. Gastrointestinal: No abdominal pain.  No nausea, no vomiting.   Genitourinary: Negative for dysuria. Musculoskeletal: Negative for back pain. Skin: Negative for rash. Neurological: Negative for headaches or weakness   ____________________________________________   PHYSICAL EXAM:  VITAL SIGNS: ED Triage Vitals  Enc Vitals Group     BP 07/20/20 0738 (!) 182/76     Pulse Rate 07/20/20 0738 63     Resp 07/20/20 0738 16     Temp 07/20/20 0738 97.6 F (36.4 C)     Temp Source 07/20/20 0738 Oral     SpO2 07/20/20 0738 98 %     Weight 07/20/20 0736 72.6 kg (160 lb)     Height 07/20/20 0736 1.727 m (5\' 8" )     Head Circumference --      Peak Flow --      Pain Score 07/20/20 0736 0     Pain Loc --      Pain Edu? --      Excl. in GC? --     Constitutional: Alert and oriented. No acute distress. Pleasant and interactive Eyes: Conjunctivae are normal.   Nose: No congestion/rhinnorhea. Mouth/Throat: Mucous membranes are moist.   Neck:  Painless ROM Cardiovascular: Normal rate, regular rhythm. Grossly normal heart sounds.  Good peripheral circulation. Respiratory: Normal respiratory effort.  No retractions. Lungs CTAB. Gastrointestinal: Soft and nontender. No distention.  No CVA tenderness.  Musculoskeletal: No lower extremity tenderness nor edema.  Warm and well perfused.  Normal strength in the lower extremities Neurologic:  Normal speech and language. No gross focal neurologic deficits are appreciated.  Ambulates with ease Skin:  Skin is warm, dry and intact. No rash noted. Psychiatric: Mood and affect are normal. Speech and behavior are normal.  ____________________________________________   LABS (all labs ordered are listed, but only abnormal results are displayed)  Labs Reviewed  COMPREHENSIVE METABOLIC  PANEL - Abnormal; Notable for the following components:      Result Value   Glucose, Bld 116 (*)    All other components within normal limits  CBC   ____________________________________________  EKG  ED ECG REPORT I, 07/22/20, the attending physician, personally viewed and interpreted this ECG.  Date: 07/20/2020  Rhythm: normal sinus rhythm QRS Axis: normal Intervals: Right bundle branch block ST/T Wave abnormalities: prolonged pr Narrative Interpretation: no evidence of acute ischemia  ____________________________________________  RADIOLOGY  none ____________________________________________   PROCEDURES  Procedure(s) performed: No  Procedures   Critical Care performed: No ____________________________________________   INITIAL IMPRESSION / ASSESSMENT AND PLAN / ED COURSE  Pertinent labs & imaging results that were available during my care of the patient were reviewed by me and considered in my medical decision making (see chart for details).  Patient quite well-appearing and in no acute distress.  Noted to be hypertensive on exam but is asymptomatic  We will check CBC  CMP, EKG  Blood pressure is improved without intervention, lab work is unremarkable, patient feels asymptomatic, no indication for further emergent work-up at this time, appropriate for discharge, recommend outpatient follow-up with PCP for blood pressure reevaluate    ____________________________________________   FINAL CLINICAL IMPRESSION(S) / ED DIAGNOSES  Final diagnoses:  Primary hypertension  Encounter for medical screening examination        Note:  This document was prepared using Dragon voice recognition software and may include unintentional dictation errors.   Jene Every, MD 07/20/20 [phone]

## 2020-09-22 NOTE — Progress Notes (Deleted)
MRN : [id number]  Christopher Parrish is a 85 y.o. (01-Aug-1935) male who presents with chief complaint of No chief complaint on file. Marland Kitchen  History of Present Illness:   The patient presents to the office for evaluation of an abdominal aortic aneurysm associated with a stable infrarenal aortic dissection. The aneurysm was found incidentally by CT scan. Patient denies abdominal pain or unusual back pain, no other abdominal complaints.  No history of an acute onset of painful blue discoloration of the toes.     No family history of AAA.   Patient denies amaurosis fugax or TIA symptoms. There is no history of claudication or rest pain symptoms of the lower extremities.  The patient denies angina or shortness of breath.  CT scan shows an AAA that measures 2.80 cm  No outpatient medications have been marked as taking for the 09/24/20 encounter (Appointment) with Gilda Crease, Latina Craver, MD.    Past Medical History:  Diagnosis Date  . Adult hypothyroidism 06/15/2012  . Arteriosclerosis of coronary artery 06/11/2012  . 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 BP (high blood pressure) 06/11/2012  . Diabetes mellitus (HCC) 06/15/2012  . 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 H/O coronary artery bypass surgery 06/15/2012   Overview:  MIDCAB x1 (partical thoracotomy), LIMA-LAD   . Hemorrhagic cerebrovascular accident (CVA) (HCC) 04/26/2013  . HLD (hyperlipidemia) 06/11/2012  . Intracranial hemorrhage (HCC) 04/25/2013  . Nephrolithiasis 03/17/2015  . Right lower quadrant pain 03/17/2015    Past Surgical History:  Procedure Laterality Date  . CARDIAC SURGERY  2014   bypass    Social History Social History   Tobacco Use  . Smoking status: Former Smoker    Quit date: 05/10/1987    Years since quitting: 33.3  . Smokeless tobacco: Never Used  Vaping Use  . Vaping Use: Never used  Substance Use Topics  . Alcohol use: No    Alcohol/week: 0.0 standard drinks  . Drug use: No    Family  History Family History  Problem Relation Age of Onset  . Diabetes Brother   . Cancer Neg Hx     No Known Allergies   REVIEW OF SYSTEMS (Negative unless checked)  Constitutional: [] Weight loss  [] Fever  [] Chills Cardiac: [] Chest pain   [] Chest pressure   [] Palpitations   [] Shortness of breath when laying flat   [] Shortness of breath with exertion. Vascular:  [] Pain in legs with walking   [] Pain in legs at rest  [] History of DVT   [] Phlebitis   [] Swelling in legs   [] Varicose veins   [] Non-healing ulcers Pulmonary:   [] Uses home oxygen   [] Productive cough   [] Hemoptysis   [] Wheeze  [] COPD   [] Asthma Neurologic:  [] Dizziness   [] Seizures   [] History of stroke   [] History of TIA  [] Aphasia   [] Vissual changes   [] Weakness or numbness in arm   [] Weakness or numbness in leg Musculoskeletal:   [] Joint swelling   [] Joint pain   [] Low back pain Hematologic:  [] Easy bruising  [] Easy bleeding   [] Hypercoagulable state   [] Anemic Gastrointestinal:  [] Diarrhea   [] Vomiting  [] Gastroesophageal reflux/heartburn   [] Difficulty swallowing. Genitourinary:  [] Chronic kidney disease   [] Difficult urination  [] Frequent urination   [] Blood in urine Skin:  [] Rashes   [] Ulcers  Psychological:  [] History of anxiety   []  History of major depression.  Physical Examination  There were no vitals filed for this visit. There is no height or weight  on file to calculate BMI. Gen: WD/WN, NAD Head: [place]/AT, No temporalis wasting.  Ear/Nose/Throat: Hearing grossly intact, nares w/o erythema or drainage Eyes: PER, EOMI, sclera nonicteric.  Neck: Supple, no large masses.   Pulmonary:  Good air movement, no audible wheezing bilaterally, no use of accessory muscles.  Cardiac: RRR, no JVD Vascular:  Vessel Right Left  Radial Palpable Palpable  PT Palpable Palpable  DP Palpable Palpable  Gastrointestinal: Non-distended. No guarding/no peritoneal signs.  Musculoskeletal: M/S 5/5 throughout.  No deformity or atrophy.   Neurologic: CN 2-12 intact. Symmetrical.  Speech is fluent. Motor exam as listed above. Psychiatric: Judgment intact, Mood & affect appropriate for pt's clinical situation. Dermatologic: No rashes or ulcers noted.  No changes consistent with cellulitis.  CBC Lab Results  Component Value Date   WBC 4.8 07/20/2020   HGB 13.4 07/20/2020   HCT 40.7 07/20/2020   MCV 87.3 07/20/2020   PLT 191 07/20/2020    BMET    Component Value Date/Time   NA 138 07/20/2020 0749   NA 140 03/17/2015 1520   NA 142 04/25/2013 1650   K 4.2 07/20/2020 0749   K 3.5 04/25/2013 1650   CL 105 07/20/2020 0749   CL 107 04/25/2013 1650   CO2 27 07/20/2020 0749   CO2 27 04/25/2013 1650   GLUCOSE 116 (H) 07/20/2020 0749   GLUCOSE 130 (H) 04/25/2013 1650   BUN 18 07/20/2020 0749   BUN 15 03/17/2015 1520   BUN 18 04/25/2013 1650   CREATININE 1.17 07/20/2020 0749   CREATININE 1.02 04/25/2013 1650   CALCIUM 9.3 07/20/2020 0749   CALCIUM 9.9 04/25/2013 1650   GFRNONAA >60 07/20/2020 0749   GFRNONAA >60 04/25/2013 1650   GFRAA >60 10/08/2019 1740   GFRAA >60 04/25/2013 1650   CrCl cannot be calculated (Patient's most recent lab result is older than the maximum 21 days allowed.).  COAG No results found for: INR, PROTIME  Radiology No results found.   Assessment/Plan There are no diagnoses linked to this encounter.   Levora Dredge, MD  09/22/2020 3:48 PM

## 2020-09-24 ENCOUNTER — Encounter (INDEPENDENT_AMBULATORY_CARE_PROVIDER_SITE_OTHER): Payer: Medicare Other

## 2020-09-24 ENCOUNTER — Ambulatory Visit (INDEPENDENT_AMBULATORY_CARE_PROVIDER_SITE_OTHER): Payer: Medicare Other | Admitting: Vascular Surgery

## 2020-09-24 DIAGNOSIS — I714 Abdominal aortic aneurysm, without rupture: Secondary | ICD-10-CM

## 2020-09-24 DIAGNOSIS — I10 Essential (primary) hypertension: Secondary | ICD-10-CM

## 2020-09-24 DIAGNOSIS — E782 Mixed hyperlipidemia: Secondary | ICD-10-CM

## 2020-09-24 DIAGNOSIS — E119 Type 2 diabetes mellitus without complications: Secondary | ICD-10-CM

## 2020-09-24 DIAGNOSIS — I2510 Atherosclerotic heart disease of native coronary artery without angina pectoris: Secondary | ICD-10-CM

## 2021-07-28 ENCOUNTER — Other Ambulatory Visit (INDEPENDENT_AMBULATORY_CARE_PROVIDER_SITE_OTHER): Payer: Self-pay | Admitting: Vascular Surgery

## 2021-07-28 DIAGNOSIS — I77819 Aortic ectasia, unspecified site: Secondary | ICD-10-CM

## 2021-07-29 ENCOUNTER — Ambulatory Visit (INDEPENDENT_AMBULATORY_CARE_PROVIDER_SITE_OTHER): Payer: Medicare Other | Admitting: Vascular Surgery

## 2021-07-29 ENCOUNTER — Ambulatory Visit (INDEPENDENT_AMBULATORY_CARE_PROVIDER_SITE_OTHER): Payer: Medicare Other

## 2021-07-29 ENCOUNTER — Encounter (INDEPENDENT_AMBULATORY_CARE_PROVIDER_SITE_OTHER): Payer: Self-pay | Admitting: Vascular Surgery

## 2021-07-29 ENCOUNTER — Other Ambulatory Visit: Payer: Self-pay

## 2021-07-29 VITALS — BP 160/80 | HR 66 | Resp 16 | Ht 66.0 in | Wt 151.0 lb

## 2021-07-29 DIAGNOSIS — I2510 Atherosclerotic heart disease of native coronary artery without angina pectoris: Secondary | ICD-10-CM

## 2021-07-29 DIAGNOSIS — I7143 Infrarenal abdominal aortic aneurysm, without rupture: Secondary | ICD-10-CM

## 2021-07-29 DIAGNOSIS — I77819 Aortic ectasia, unspecified site: Secondary | ICD-10-CM

## 2021-07-29 DIAGNOSIS — I739 Peripheral vascular disease, unspecified: Secondary | ICD-10-CM | POA: Diagnosis not present

## 2021-07-29 DIAGNOSIS — E782 Mixed hyperlipidemia: Secondary | ICD-10-CM

## 2021-07-29 DIAGNOSIS — I10 Essential (primary) hypertension: Secondary | ICD-10-CM | POA: Diagnosis not present

## 2021-07-29 DIAGNOSIS — Z794 Long term (current) use of insulin: Secondary | ICD-10-CM

## 2021-07-29 DIAGNOSIS — E119 Type 2 diabetes mellitus without complications: Secondary | ICD-10-CM

## 2021-07-29 NOTE — Progress Notes (Signed)
MRN : [id number]  Christopher Parrish is a 86 y.o. ([birth year]) male who presents with chief complaint of check AAA.  History of Present Illness:   The patient presents to the office for evaluation of an abdominal aortic aneurysm. The aneurysm was found incidentally years ago. Patient denies abdominal pain or unusual back pain, no other abdominal complaints.  No history of an acute onset of painful blue discoloration of the toes.     No family history of AAA.   Patient denies amaurosis fugax or TIA symptoms. There is no history of claudication or rest pain symptoms of the lower extremities.  The patient denies angina or shortness of breath.  CT scan shows an AAA that measures 3.30 cm (slight increase in size from 2.7 cm from CT scan on 10/09/2019)  Current Meds  Medication Sig   aspirin EC 81 MG tablet Take by mouth.   levothyroxine (SYNTHROID, LEVOTHROID) 100 MCG tablet Take 100 mcg by mouth daily before breakfast.   lisinopril-hydrochlorothiazide (PRINZIDE,ZESTORETIC) 10-12.5 MG tablet Take 1 tablet by mouth daily.   omeprazole (PRILOSEC) 20 MG capsule Take by mouth.    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   Overview:  left    H/O coronary artery bypass surgery 06/15/2012   Overview:  MIDCAB x1 (partical thoracotomy), LIMA-LAD    Hemorrhagic cerebrovascular accident (CVA) (HCC) 04/26/2013   HLD (hyperlipidemia) 06/11/2012   Intracranial hemorrhage (HCC) 04/25/2013   Nephrolithiasis 03/17/2015   Right lower quadrant pain 03/17/2015    Past Surgical History:  Procedure Laterality Date   CARDIAC SURGERY  2014   bypass    Social History Social History   Tobacco Use   Smoking status: Former    Types: Cigarettes    Quit date: 05/10/1987    Years since quitting: 34.2    Smokeless tobacco: Never  Vaping Use   Vaping Use: Never used  Substance Use Topics   Alcohol use: No    Alcohol/week: 0.0 standard drinks   Drug use: No    Family History Family History  Problem Relation Age of Onset   Diabetes Brother    Cancer Neg Hx     No Known Allergies   REVIEW OF SYSTEMS (Negative unless checked)  Constitutional: [] Weight loss  [] Fever  [] Chills Cardiac: [] Chest pain   [] Chest pressure   [] Palpitations   [] Shortness of breath when laying flat   [] Shortness of breath with exertion. Vascular:  [] Pain in legs with walking   [] Pain in legs at rest  [] History of DVT   [] Phlebitis   [] Swelling in legs   [] Varicose veins   [] Non-healing ulcers Pulmonary:   [] Uses home oxygen   [] Productive cough   [] Hemoptysis   [] Wheeze  [] COPD   [] Asthma Neurologic:  [] Dizziness   [] Seizures   [] History of stroke   [] History of TIA  [] Aphasia   [] Vissual changes   [] Weakness or numbness in arm   [] Weakness or numbness in leg Musculoskeletal:   [] Joint swelling   [] Joint pain   [] Low back pain Hematologic:  [] Easy bruising  [] Easy bleeding   [] Hypercoagulable state   [] Anemic Gastrointestinal:  [] Diarrhea   [] Vomiting  [] Gastroesophageal reflux/heartburn   [] Difficulty swallowing. Genitourinary:  [] Chronic kidney disease   [] Difficult urination  [] Frequent urination   [] Blood  in urine Skin:  [] Rashes   [] Ulcers  Psychological:  [] History of anxiety   []  History of major depression.  Physical Examination  Vitals:   07/29/21 0913  BP: (!) 160/80  Pulse: 66  Resp: 16  Weight: 151 lb (68.5 kg)  Height: 5\' 6"  (1.676 m)   Body mass index is 24.37 kg/m. Gen: WD/WN, NAD Head: [place]/AT, No temporalis wasting.  Ear/Nose/Throat: Hearing grossly intact, nares w/o erythema or drainage Eyes: PER, EOMI, sclera nonicteric.  Neck: Supple, no masses.  No bruit or JVD.  Pulmonary:  Good air movement, no audible wheezing, no use of accessory muscles.  Cardiac: RRR, normal S1, S2, no  Murmurs. Vascular:   no carotid bruits noted Vessel Right Left  Radial Palpable Palpable  Carotid Palpable Palpable  Gastrointestinal: soft, non-distended. No guarding/no peritoneal signs.  Musculoskeletal: M/S 5/5 throughout.  No visible deformity.  Neurologic: CN 2-12 intact. Pain and light touch intact in extremities.  Symmetrical.  Speech is fluent. Motor exam as listed above. Psychiatric: Judgment intact, Mood & affect appropriate for pt's clinical situation. Dermatologic: No rashes or ulcers noted.  No changes consistent with cellulitis.   CBC Lab Results  Component Value Date   WBC 4.8 07/20/2020   HGB 13.4 07/20/2020   HCT 40.7 07/20/2020   MCV 87.3 07/20/2020   PLT 191 07/20/2020    BMET    Component Value Date/Time   NA 138 07/20/2020 0749   NA 140 03/17/2015 1520   NA 142 04/25/2013 1650   K 4.2 07/20/2020 0749   K 3.5 04/25/2013 1650   CL 105 07/20/2020 0749   CL 107 04/25/2013 1650   CO2 27 07/20/2020 0749   CO2 27 04/25/2013 1650   GLUCOSE 116 (H) 07/20/2020 0749   GLUCOSE 130 (H) 04/25/2013 1650   BUN 18 07/20/2020 0749   BUN 15 03/17/2015 1520   BUN 18 04/25/2013 1650   CREATININE 1.17 07/20/2020 0749   CREATININE 1.02 04/25/2013 1650   CALCIUM 9.3 07/20/2020 0749   CALCIUM 9.9 04/25/2013 1650   GFRNONAA >60 07/20/2020 0749   GFRNONAA >60 04/25/2013 1650   GFRAA >60 10/08/2019 1740   GFRAA >60 04/25/2013 1650   CrCl cannot be calculated (Patient's most recent lab result is older than the maximum 21 days allowed.).  COAG No results found for: INR, PROTIME  Radiology No results found.   Assessment/Plan 1. Infrarenal abdominal aortic aneurysm (AAA) without rupture No surgery or intervention at this time. The patient has an asymptomatic abdominal aortic aneurysm that is less than 4 cm in maximal diameter.  I have discussed the natural history of abdominal aortic aneurysm and the small risk of rupture for aneurysm less than 5 cm in size.   However, as these small aneurysms tend to enlarge over time, continued surveillance with ultrasound or CT scan is mandatory.  I have also discussed optimizing medical management with hypertension and lipid control and the importance of abstinence from tobacco.  The patient is also encouraged to exercise a minimum of 30 minutes 4 times a week.  Should the patient develop new onset abdominal or back pain or signs of peripheral embolization they are instructed to seek medical attention immediately and to alert the physician providing care that they have an aneurysm.  The patient voices their understanding. The patient will return in 12 months with an aortic duplex.  - VAS US AORTA/IVC/ILIACS; Future  2. PAD (peripheral artery disease) (HCC)  Recommend:  The patient has evidence of atherosclerosis  of the lower extremities with claudication.  The patient does not voice lifestyle limiting changes at this point in time.  No invasive studies, angiography or surgery at this time The patient should continue walking and begin a more formal exercise program.  The patient should continue antiplatelet therapy and aggressive treatment of the lipid abnormalities  No changes in the patient's medications at this time  The patient should continue wearing graduated compression socks 10-15 mmHg strength to control the mild edema.   - VAS [place] ABI WITH/WO TBI; Future  3. Arteriosclerosis of coronary artery Continue cardiac and antihypertensive medications as already ordered and reviewed, no changes at this time.  Continue statin as ordered and reviewed, no changes at this time  Nitrates PRN for chest pain   4. Primary hypertension Continue antihypertensive medications as already ordered, these medications have been reviewed and there are no changes at this time.   5. Type 2 diabetes mellitus without complication, with long-term current use of insulin (HCC) Continue hypoglycemic medications as already  ordered, these medications have been reviewed and there are no changes at this time.  Hgb A1C to be monitored as already arranged by primary service   6. Mixed hyperlipidemia Continue statin as ordered and reviewed, no changes at this time     Levora Dredge, MD  07/29/2021 10:30 AM

## 2021-11-12 IMAGING — CR DG CHEST 2V
1 series · 2 of 2 positions shown · non-contrast
Comparison: None.

CLINICAL DATA: 84-year-old male with chest pain.

EXAM:
CHEST - 2 VIEW

[Series 1: dg chest 2 view · 0.14mm/px · 2 of 2 slices shown]
[im 1/2]
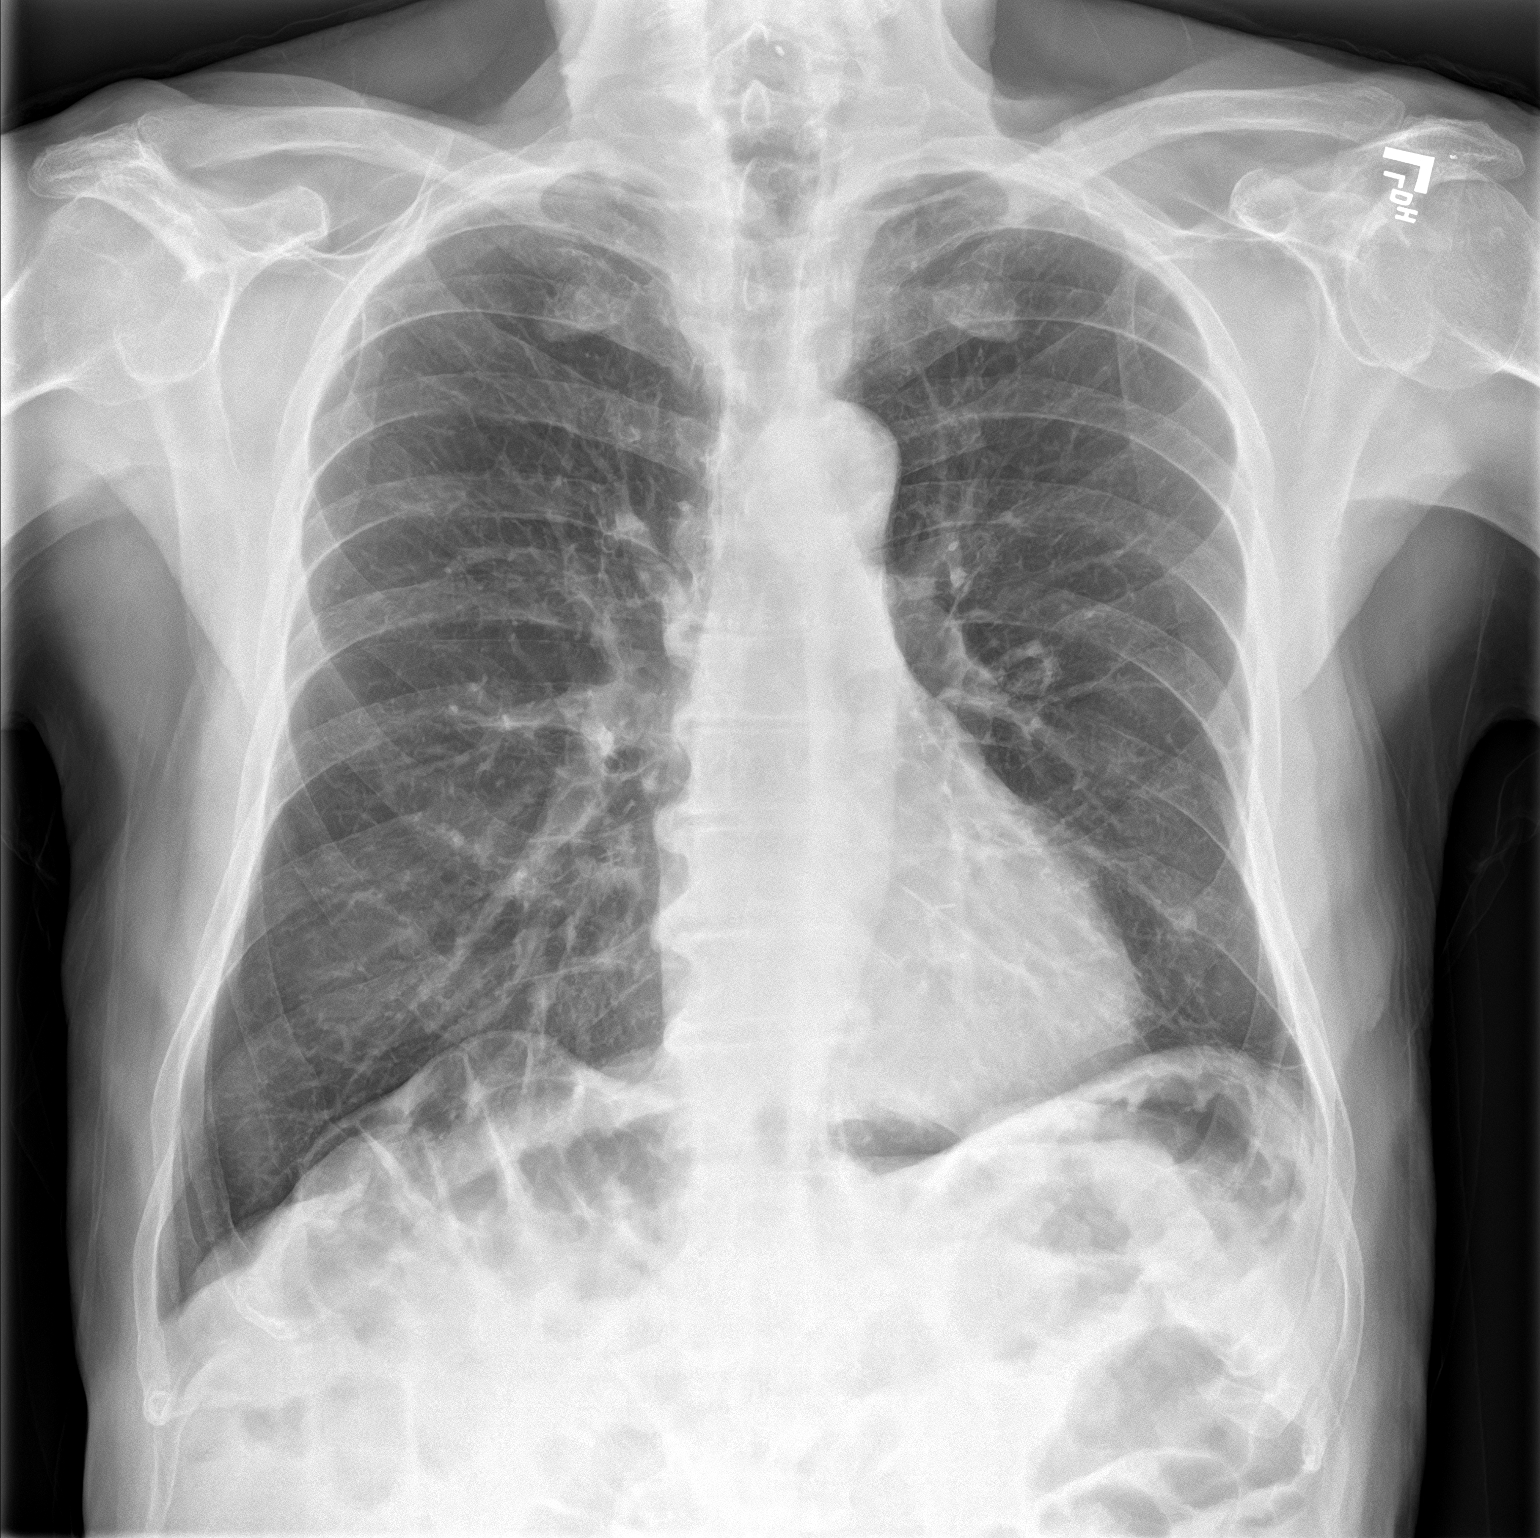
[im 2/2]
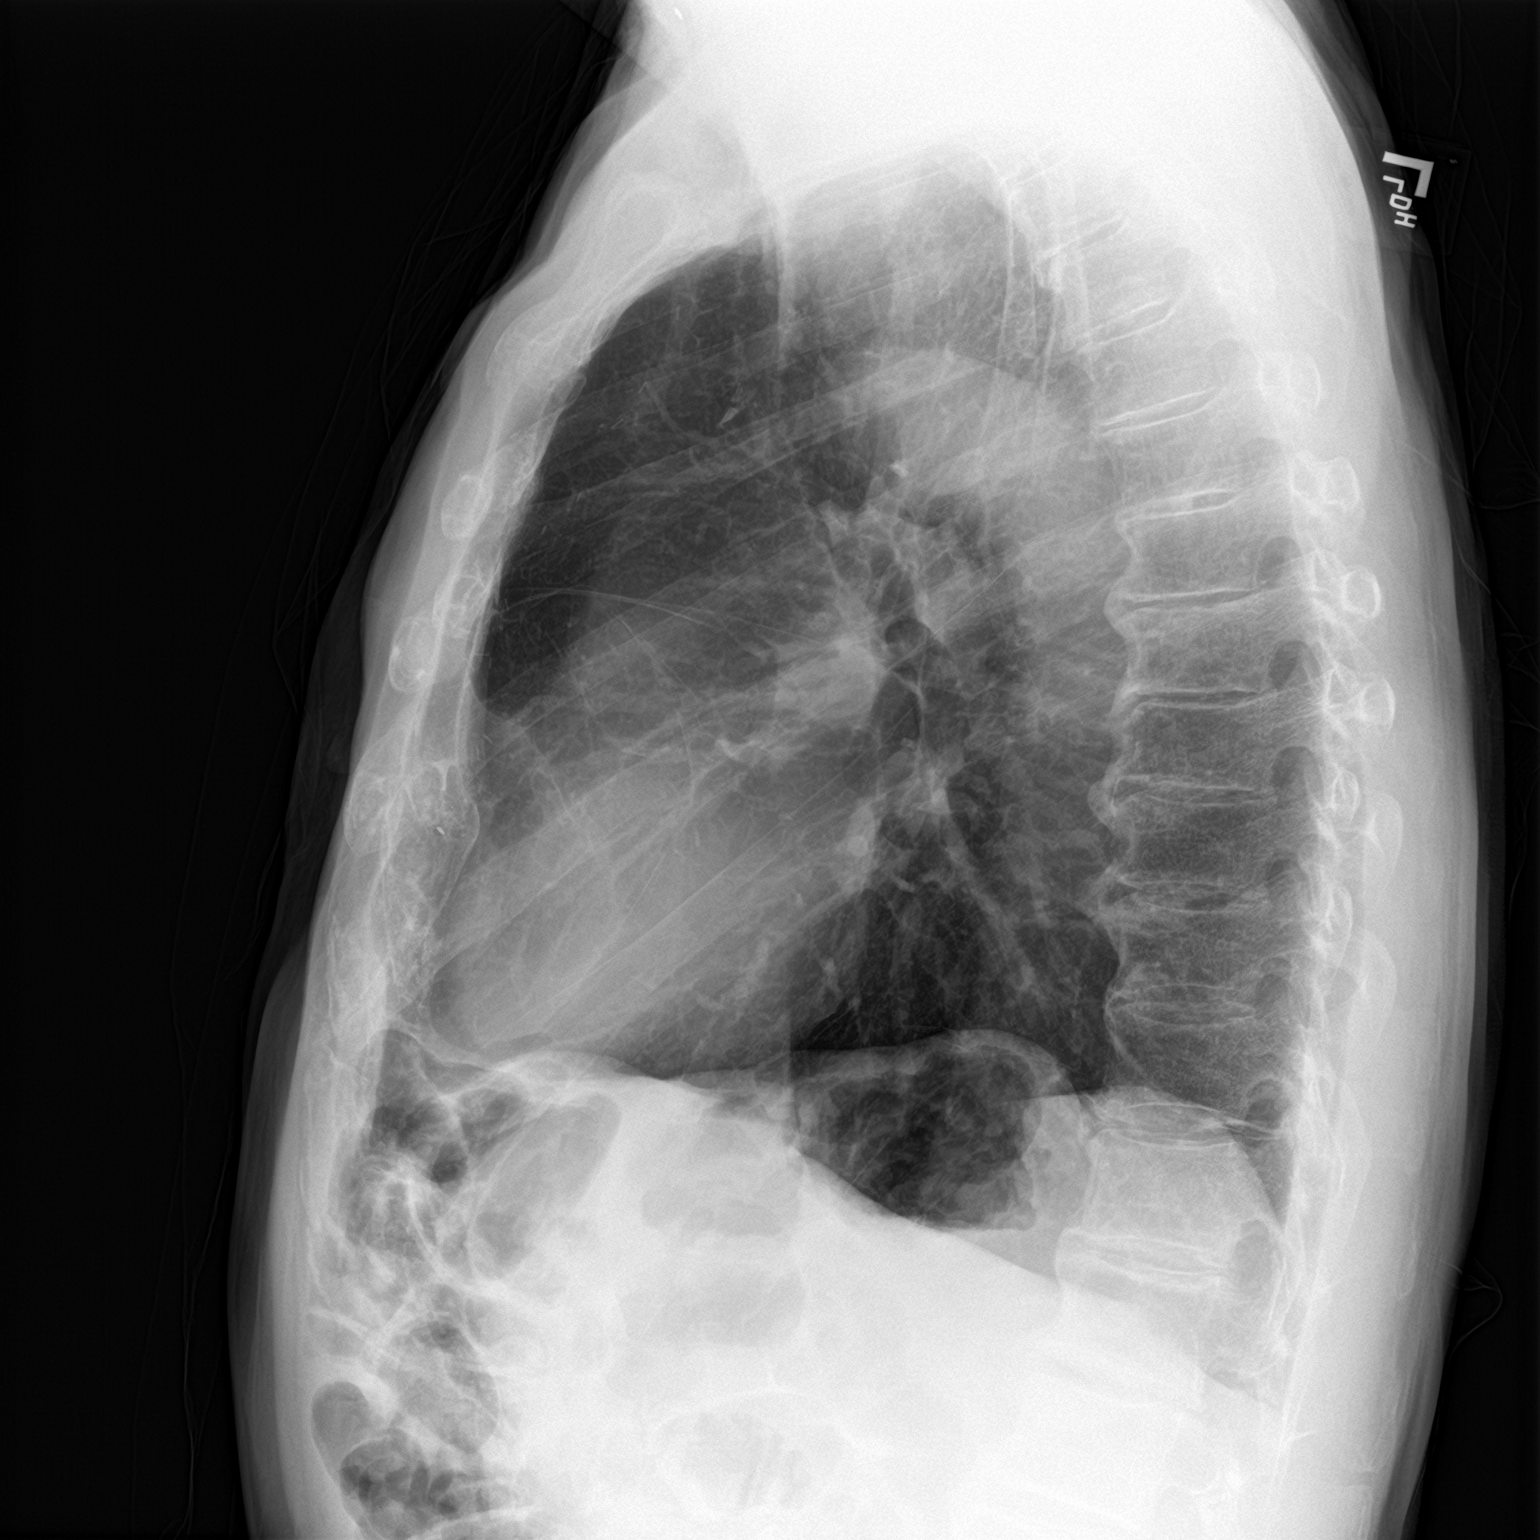

[2 of 2 positions shown; findings below may reference images not displayed]

FINDINGS: The lungs are clear. There is no pleural effusion pneumothorax. The
cardiac silhouette is within limits. No acute osseous pathology.
Atherosclerotic calcification of the aortic arch.
IMPRESSION: No active cardiopulmonary disease.

## 2021-11-13 IMAGING — CT CT ANGIO CHEST-ABD-PELV FOR DISSECTION W/ AND WO/W CM
2 of 7 series · 13 of 46 positions shown, 15 images · IV contrast (APPLIED)
Comparison: Chest x-ray from the previous day, CT from 03/24/2015.

CLINICAL DATA: Chest and abdominal pain

EXAM:
CT ANGIOGRAPHY CHEST, ABDOMEN AND PELVIS
TECHNIQUE: Non-contrast CT of the chest was initially obtained.
Multidetector CT imaging through the chest, abdomen and pelvis was
performed using the standard protocol during bolus administration of
intravenous contrast. Multiplanar reconstructed images and MIPs were
obtained and reviewed to evaluate the vascular anatomy.
CONTRAST:  125 mL Omnipaque 350.

[Series 6: axial arterial · axial · arterial · 0.79mm/px · z∈[-909,-366]mm · 10 of 213 slices shown, 12 images]
[im 16/213  soft-tissue]
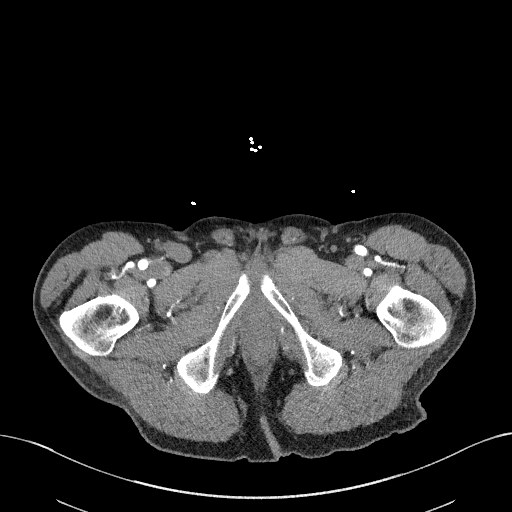
[im 16/213  bone]
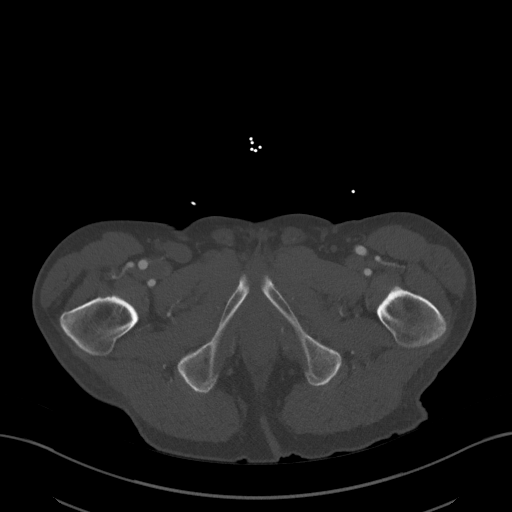
[im 31/213  soft-tissue]
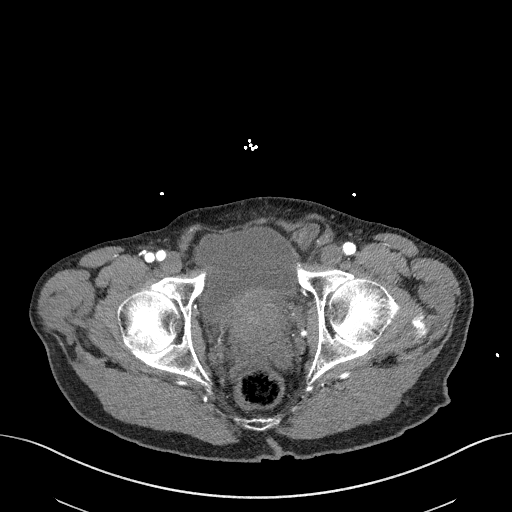
[im 61/213  soft-tissue]
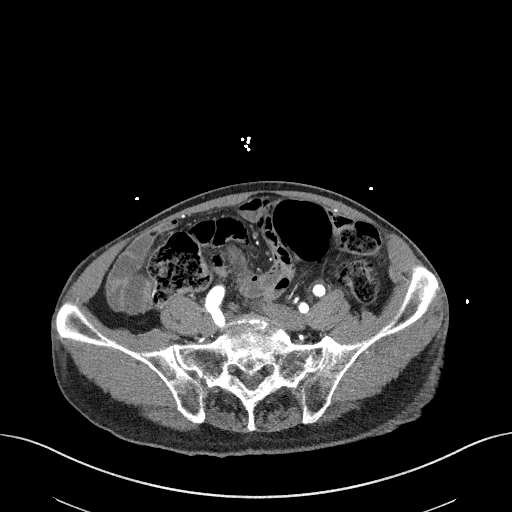
[im 76/213  soft-tissue]
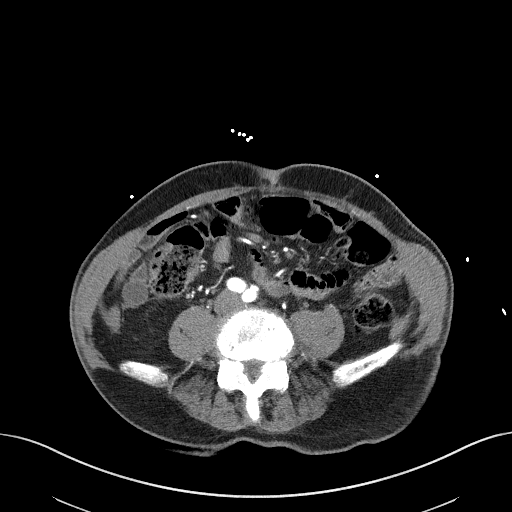
[im 91/213  soft-tissue]
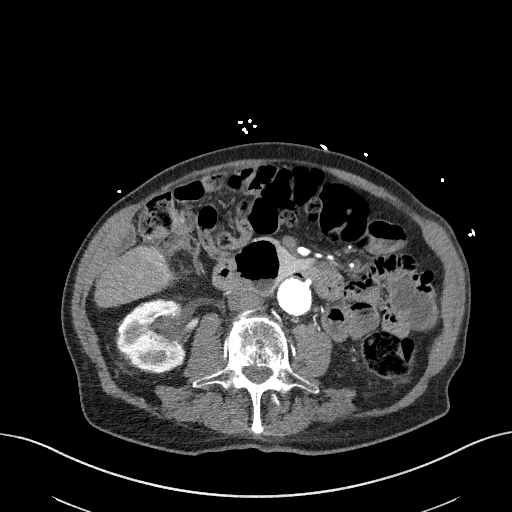
[im 122/213  soft-tissue]
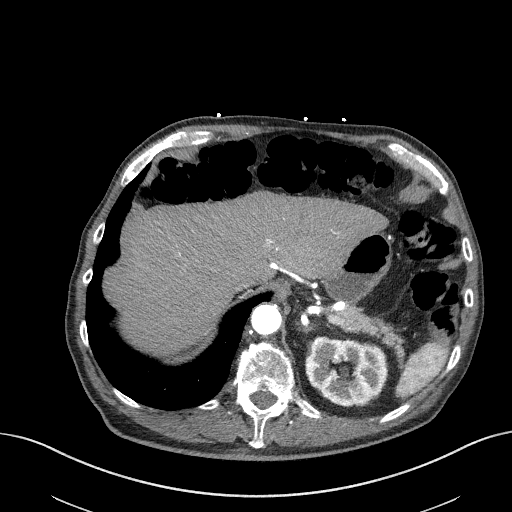
[im 137/213  soft-tissue]
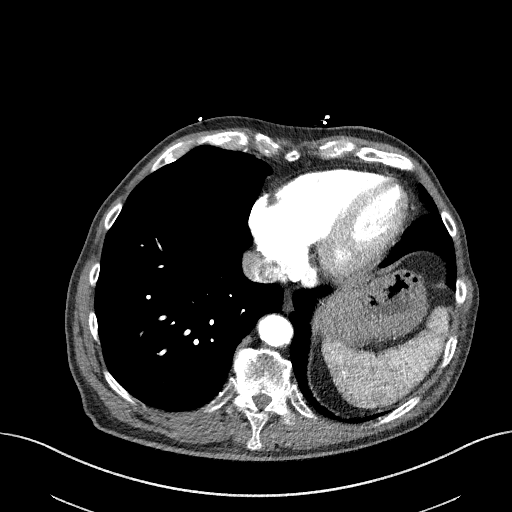
[im 152/213  soft-tissue]
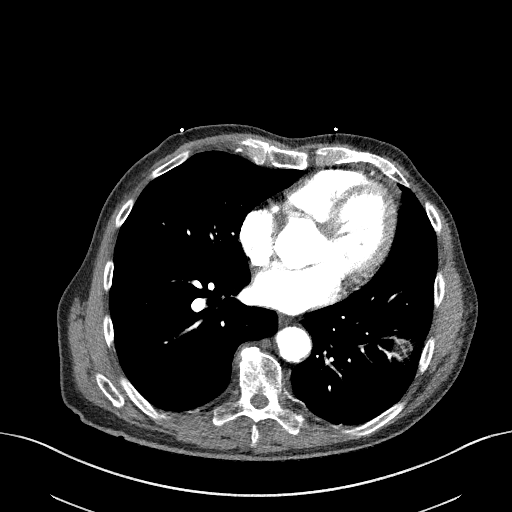
[im 182/213  soft-tissue]
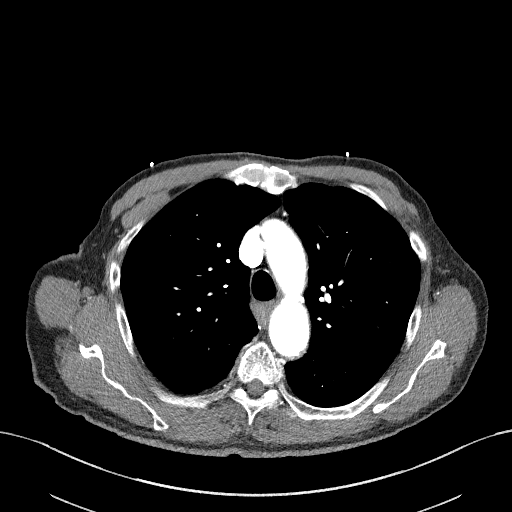
[im 182/213  bone]
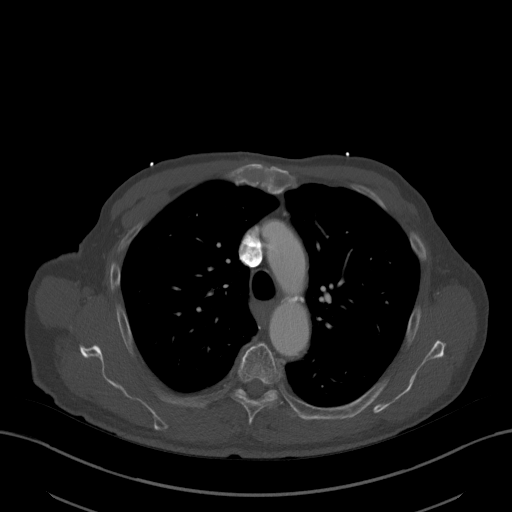
[im 197/213  soft-tissue]
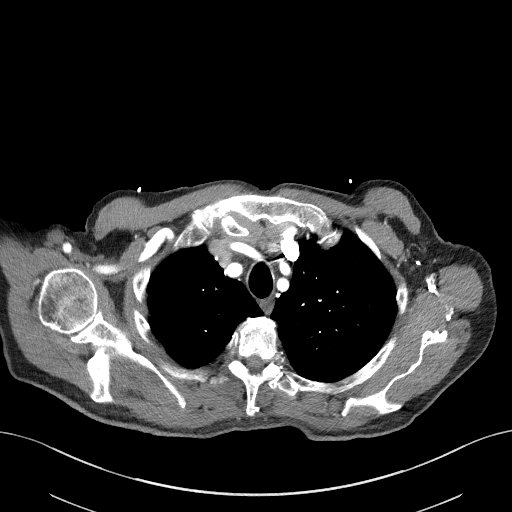

[Series 8: coronals · coronal · 0.77mm/px · 3 of 129 slices shown]
[im 33/129  soft-tissue]
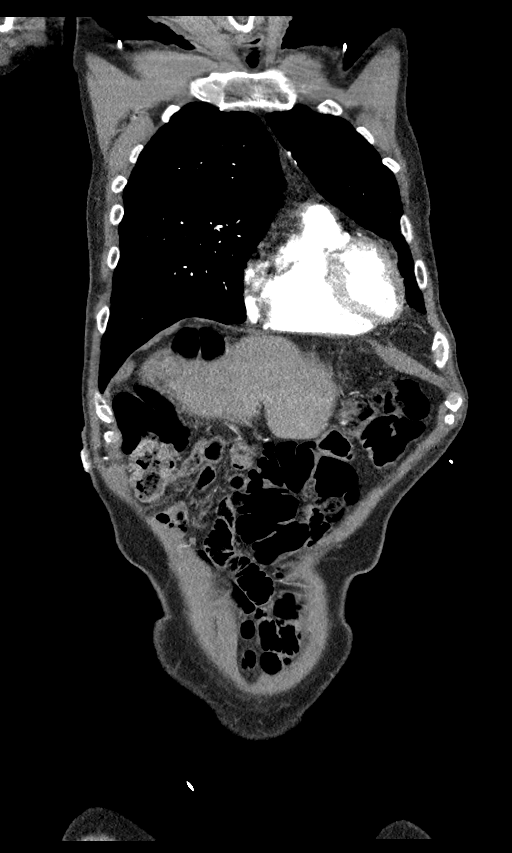
[im 65/129  soft-tissue]
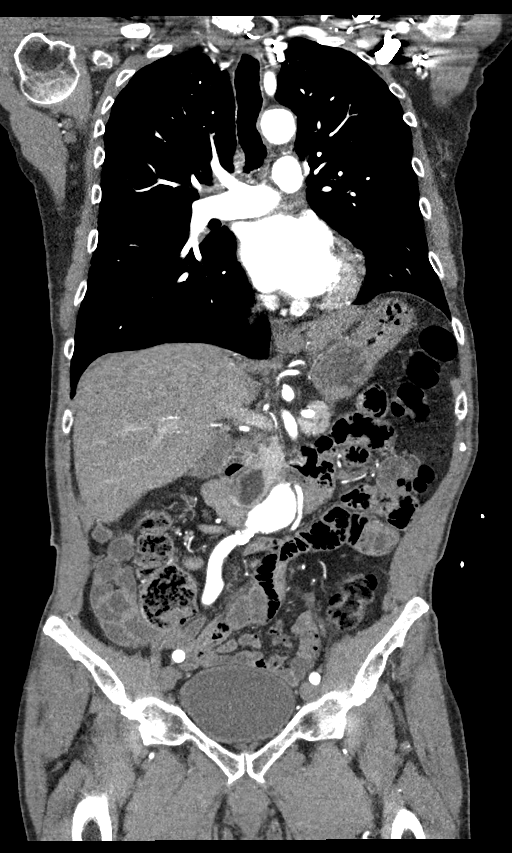
[im 97/129  soft-tissue]
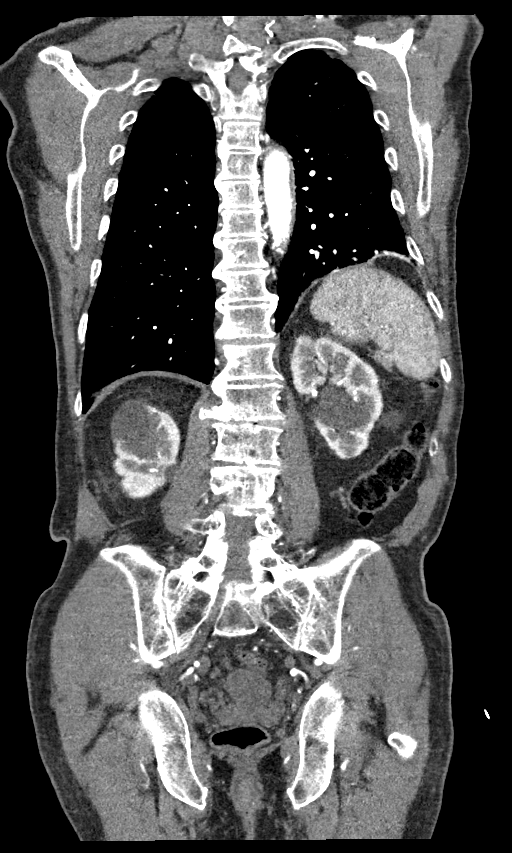

[13 of 46 positions shown; findings below may reference images not displayed]

FINDINGS: CTA CHEST FINDINGS

Cardiovascular: Initial precontrast imaging of the thoracic aorta
was performed and demonstrated no definitive hyperdense crescent to
suggest acute aortic injury.

Subsequently contrast was administered. The thoracic aorta
demonstrates atherosclerotic calcification without aneurysmal
dilatation or dissection. No cardiac enlargement is seen. The
coronary arteries demonstrate mild atherosclerotic calcification.
Pulmonary artery shows a normal enhancement pattern. No findings to
suggest pulmonary emboli are seen.

Mediastinum/Nodes: Thoracic inlet is within normal limits. No hilar
or mediastinal adenopathy is noted. The esophagus as visualized is
within normal limits.

Lungs/Pleura: Lungs are well aerated bilaterally. No focal
infiltrate or sizable effusion is seen. No sizable parenchymal
nodules are noted. No pneumothorax is identified.

Musculoskeletal: Degenerative changes of the thoracic spine are
noted. No rib abnormality is seen.

Review of the MIP images confirms the above findings.

CTA ABDOMEN AND PELVIS FINDINGS

VASCULAR

Aorta: Abdominal aorta shows minimal ulceration and chronic
dissection in the infrarenal aorta along the left lateral wall. When
compared with the prior exam this is stable in appearance. Scattered
atherosclerotic calcification is noted. Tortuosity of the abdominal
aorta is seen with mild dilatation up to 2.7 cm.

Celiac: Patent without evidence of aneurysm, dissection, vasculitis
or significant stenosis.

SMA: Patent without evidence of aneurysm, dissection, vasculitis or
significant stenosis.

Renals: Both renal arteries are patent without evidence of aneurysm,
dissection, vasculitis, fibromuscular dysplasia or significant
stenosis.

IMA: Patent without evidence of aneurysm, dissection, vasculitis or
significant stenosis.

Iliacs: Scattered atherosclerotic calcifications are noted without
aneurysmal dilatation.

Veins: No specific venous abnormality is noted.

Review of the MIP images confirms the above findings.

NON-VASCULAR

Hepatobiliary: Gallbladder is partially distended. No cholelithiasis
is seen. The liver demonstrates diffuse decreased attenuation
consistent with fatty infiltration. Scattered calcified granulomas
are seen.

Pancreas: Within normal limits.

Spleen: Normal in size without focal abnormality.

Adrenals/Urinary Tract: Adrenal glands are unremarkable. Kidneys
demonstrate a normal enhancement pattern bilaterally. Multiple
cortical and parapelvic cysts are seen. Previously seen renal
calculi are noted. There is a lower pole 7 mm stone identified on
the right and lower pole 7-8 mm stone on the left stable from the
prior exam. No obstructive changes are seen. The bladder is well
distended.

Stomach/Bowel: No obstructive or inflammatory changes of the colon
are seen. The appendix is well visualized and within normal limits.
The small bowel and stomach are unremarkable with the exception of a
large duodenal diverticulum adjacent to the head of the pancreas.

Lymphatic: No specific lymphadenopathy is noted.

Reproductive: Prostate is enlarged in size indenting upon the
inferior aspect of the bladder.

Other: No free fluid is seen.

Musculoskeletal: Degenerative changes of lumbar spine are noted.

Review of the MIP images confirms the above findings.
IMPRESSION: CTA of the chest: No evidence of aortic dissection or aneurysmal
dilatation.

No definitive pulmonary emboli are noted.

No acute abnormality is seen.

CTA of the abdomen and pelvis: Chronic minimal ulceration and
dissection in the infrarenal aorta.

Mild dilatation of the infrarenal aorta to 2.7 cm is noted.

Bilateral nonobstructing renal calculi.

No acute abnormality is identified to correspond with the patient's
clinical history.

Critical Value/emergent results were called by telephone at the time
of interpretation on 10/09/2019 at [DATE] to Dr. Ako, who verbally
acknowledged these results.

## 2021-11-14 ENCOUNTER — Emergency Department
Admission: EM | Admit: 2021-11-14 | Discharge: 2021-11-14 | Disposition: A | Discharge status: Home / Self Care | Payer: Medicare Other | Attending: Emergency Medicine | Admitting: Emergency Medicine

## 2021-11-14 ENCOUNTER — Emergency Department: Payer: Medicare Other

## 2021-11-14 ENCOUNTER — Other Ambulatory Visit: Payer: Self-pay

## 2021-11-14 DIAGNOSIS — Z951 Presence of aortocoronary bypass graft: Secondary | ICD-10-CM | POA: Diagnosis not present

## 2021-11-14 DIAGNOSIS — I2510 Atherosclerotic heart disease of native coronary artery without angina pectoris: Secondary | ICD-10-CM | POA: Insufficient documentation

## 2021-11-14 DIAGNOSIS — I10 Essential (primary) hypertension: Secondary | ICD-10-CM | POA: Insufficient documentation

## 2021-11-14 DIAGNOSIS — E119 Type 2 diabetes mellitus without complications: Secondary | ICD-10-CM | POA: Insufficient documentation

## 2021-11-14 DIAGNOSIS — R202 Paresthesia of skin: Secondary | ICD-10-CM | POA: Diagnosis present

## 2021-11-14 LAB — CBC WITH DIFFERENTIAL/PLATELET
Abs Immature Granulocytes: 0.02 10*3/uL (ref 0.00–0.07)
Basophils Absolute: 0 10*3/uL (ref 0.0–0.1)
Basophils Relative: 1 %
Eosinophils Absolute: 0.1 10*3/uL (ref 0.0–0.5)
Eosinophils Relative: 2 %
HCT: 38.3 % — ABNORMAL LOW (ref 39.0–52.0)
Hemoglobin: 12.5 g/dL — ABNORMAL LOW (ref 13.0–17.0)
Immature Granulocytes: 0 %
Lymphocytes Relative: 17 %
Lymphs Abs: 1 10*3/uL (ref 0.7–4.0)
MCH: 28.7 pg (ref 26.0–34.0)
MCHC: 32.6 g/dL (ref 30.0–36.0)
MCV: 87.8 fL (ref 80.0–100.0)
Monocytes Absolute: 0.4 10*3/uL (ref 0.1–1.0)
Monocytes Relative: 6 %
Neutro Abs: 4.7 10*3/uL (ref 1.7–7.7)
Neutrophils Relative %: 74 %
Platelets: 155 10*3/uL (ref 150–400)
RBC: 4.36 MIL/uL (ref 4.22–5.81)
RDW: 13.2 % (ref 11.5–15.5)
WBC: 6.2 10*3/uL (ref 4.0–10.5)
nRBC: 0 % (ref 0.0–0.2)

## 2021-11-14 LAB — COMPREHENSIVE METABOLIC PANEL
ALT: 14 U/L (ref 0–44)
AST: 15 U/L (ref 15–41)
Albumin: 3.9 g/dL (ref 3.5–5.0)
Alkaline Phosphatase: 58 U/L (ref 38–126)
Anion gap: 6 (ref 5–15)
BUN: 19 mg/dL (ref 8–23)
CO2: 28 mmol/L (ref 22–32)
Calcium: 8.6 mg/dL — ABNORMAL LOW (ref 8.9–10.3)
Chloride: 106 mmol/L (ref 98–111)
Creatinine, Ser: 0.95 mg/dL (ref 0.61–1.24)
GFR, Estimated: >60 mL/min (ref >60)
Glucose, Bld: 109 mg/dL — ABNORMAL HIGH (ref 70–99)
Potassium: 3.9 mmol/L (ref 3.5–5.1)
Sodium: 140 mmol/L (ref 135–145)
Total Bilirubin: 0.6 mg/dL (ref 0.3–1.2)
Total Protein: 6.3 g/dL — ABNORMAL LOW (ref 6.5–8.1)

## 2021-11-14 LAB — TROPONIN I (HIGH SENSITIVITY): Troponin I (High Sensitivity): 5 ng/L (ref <18)

## 2021-11-14 NOTE — ED Notes (Signed)
Pt A&O, IV removed, pt given discharge instructions, pt ambulating with steady gait. 

## 2021-11-14 NOTE — Discharge Instructions (Addendum)
Your CT and MRI showed no signs of a new stroke.  Your lab work-up is normal.  Follow-up with your regular doctor.  Return to the ER for new, worsening, or persistent numbness especially on 1 side, any weakness, difficulty with grip, problems walking or holding her balance, severe headache, vision changes, difficulty with speaking or understanding, or any other new or worsening symptoms that concern you.

## 2021-11-14 NOTE — ED Triage Notes (Signed)
Pt comes with c/o left sided facial, arm and leg numbness. Pt states this started on Saturday. Pt states some numbness to right leg as well. Pt states his BP has been elevated. Pt states he does take Bp meds. Pt states hx of stroke in past.

## 2021-11-14 NOTE — ED Provider Notes (Signed)
[facility] Provider Note    Event Date/Time   First MD Initiated Contact with Patient 11/14/21 1105     (approximate)   History   Numbness   HPI  Christopher Parrish is a 86 y.o. male with history of hypertension, diabetes, CAD, AAA, and prior stroke with left-sided deficits who presents with numbness and tingling to multiple extremities over the last 2 days.  He reports numbness and tingling to both legs and feet intermittently as well as some tingling on the left side of his face and mouth, as well as the arm.  This is the side where he previously had a stroke.  He has had all of these symptoms previously but they became more noticeable in the last few days.  He is not having any acute symptoms now.  He denies any weakness, new or worsening difficulty walking, vision changes, difficulty speaking, or any other acute symptoms.    Physical Exam   Triage Vital Signs: ED Triage Vitals [11/14/21 1103]  Enc Vitals Group     BP (!) 165/76     Pulse Rate (!) 57     Resp 18     Temp 98 F (36.7 C)     Temp src      SpO2 97 %     Weight      Height      Head Circumference      Peak Flow      Pain Score      Pain Loc      Pain Edu?      Excl. in GC?     Most recent vital signs: Vitals:   11/14/21 1230 11/14/21 1300  BP: (!) 157/79 138/76  Pulse: (!) 51 (!) 46  Resp: 16 12  Temp:    SpO2: 99% 97%     General: Alert and oriented, comfortable appearing. CV:  Good peripheral perfusion.  Resp:  Normal effort.  Abd:  No distention.  Other:  EOMI.  PERRLA.  Mild left facial droop which patient reports is chronic.  Cranial nerves III through XII otherwise intact.  Motor and sensory intact in all extremities.  No pronator drift.  No ataxia on finger-to-nose.   ED Results / Procedures / Treatments   Labs (all labs ordered are listed, but only abnormal results are displayed) Labs Reviewed  COMPREHENSIVE METABOLIC PANEL - Abnormal; Notable for the  following components:      Result Value   Glucose, Bld 109 (*)    Calcium 8.6 (*)    Total Protein 6.3 (*)    All other components within normal limits  CBC WITH DIFFERENTIAL/PLATELET - Abnormal; Notable for the following components:   Hemoglobin 12.5 (*)    HCT 38.3 (*)    All other components within normal limits  TROPONIN I (HIGH SENSITIVITY)     EKG  ED ECG REPORT I, Dionne Bucy, the attending physician, personally viewed and interpreted this ECG.  Date: 11/14/2021 EKG Time: 1106 Rate: 53 Rhythm: normal sinus rhythm QRS Axis: normal Intervals: Prolonged PR, RBBB ST/T Wave abnormalities: normal Narrative Interpretation: no evidence of acute ischemia    RADIOLOGY  CT head: I independently viewed and interpreted the images; there is no ICH or evidence of acute stroke  MR brain: IMPRESSION:  1. No evidence of acute intracranial abnormality.  2. 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  3. Prior right basal ganglia the perforator infarct.  PROCEDURES:  Critical Care performed: No  Procedures   MEDICATIONS ORDERED IN ED: Medications - No data to display   IMPRESSION / MDM / ASSESSMENT AND PLAN / ED COURSE  I reviewed the triage vital signs and the nursing notes.  86 year old male with PMH as noted above presents with numbness and tingling for the last 2 days to both lower extremities as well as the left side of the face and left upper extremity where he has deficits from a prior stroke.  He is not have any active symptoms at this time.  I reviewed the past medical records.  Patient has no recent prior admissions.  He has no recent neurology visits.  He follows with cardiology and was last seen there on 2/7 due to CAD status post CABG.  He has a prior history of intracranial hemorrhage.  At this time his vital signs are normal except for mild hypertension.  Neurologic exam is normal except  for a faint left facial droop which the patient states is his baseline after his prior stroke.    Differential diagnosis includes, but is not limited to, TIA, CVA, dehydration, electrolyte abnormality, other metabolic disturbance, less likely cardiac cause.  Patient's presentation is most consistent with acute presentation with potential threat to life or bodily function.  We will obtain basic labs, troponin, CT head, and reassess.  The patient is on the cardiac monitor to evaluate for evidence of arrhythmia and/or significant heart rate changes.  ----------------------------------------- 3:16 PM on 11/14/2021 -----------------------------------------  CBC shows no acute findings.  CMP shows normal electrolytes.  Troponin is negative.  Given the duration of the symptoms and the lack of any chest pain or EKG changes there is no indication for a repeat troponin.  CT head shows no evidence of acute stroke.  I consulted Dr. Selina Cooley from neurology and discussed the patient with her.  She recommends an MRI for further evaluation due to the patient's prior history, however she advises that if this is negative the patient would be appropriate for discharge home with outpatient follow-up.  MRI shows no acute findings.  On reassessment the patient has no new symptoms.  He is ambulating without difficulty.  He reports mild paresthesias to both feet but no numbness or weakness on the left side right now.  At this time there is no evidence of acute stroke or other emergency condition.  The patient is stable for discharge home.  I counseled him on the results of the work-up and on neurology recommendations.  Return precautions given, and he expresses understanding.   FINAL CLINICAL IMPRESSION(S) / ED DIAGNOSES   Final diagnoses:  Paresthesia     Rx / DC Orders   ED Discharge Orders     None        Note:  This document was prepared using Dragon voice recognition software and may include  unintentional dictation errors.    Dionne Bucy, MD 11/14/21 [phone]

## 2022-07-22 NOTE — Progress Notes (Deleted)
MRN : [id number]  Christopher Parrish is a 87 y.o. (1936-04-09) male who presents with chief complaint of check circulation.  History of Present Illness:   The patient presents to the office for evaluation of an abdominal aortic aneurysm. The aneurysm was found incidentally years ago. Patient denies abdominal pain or unusual back pain, no other abdominal complaints.  No history of an acute onset of painful blue discoloration of the toes.      No family history of AAA.    Patient denies amaurosis fugax or TIA symptoms. There is no history of claudication or rest pain symptoms of the lower extremities.  The patient denies angina or shortness of breath.   CT scan shows an AAA that measures 3.30 cm (slight increase in size from 2.7 cm from CT scan on 10/09/2019)    No outpatient medications have been marked as taking for the 07/25/22 encounter (Appointment) with Delana Meyer, Dolores Lory, MD.    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/2013   Overview:  left    H/O coronary artery bypass surgery 06/15/2012   Overview:  MIDCAB x1 (partical thoracotomy), LIMA-LAD    Hemorrhagic cerebrovascular accident (CVA) ([place]) 04/26/2013   HLD (hyperlipidemia) 06/11/2012   Intracranial hemorrhage ([place]) 04/25/2013   Nephrolithiasis 03/17/2015   Right lower quadrant pain 03/17/2015    Past Surgical History:  Procedure Laterality Date   CARDIAC SURGERY  2014   bypass    Social History Social History   Tobacco Use   Smoking status: Former    Types: Cigarettes    Quit date: 05/10/1987    Years since quitting: 35.2   Smokeless tobacco: Never  Vaping Use   Vaping Use: Never used  Substance Use Topics   Alcohol use: No    Alcohol/week: 0.0 standard drinks of alcohol   Drug use: No     Family History Family History  Problem Relation Age of Onset   Diabetes Brother    Cancer Neg Hx     No Known Allergies   REVIEW OF SYSTEMS (Negative unless checked)  Constitutional: [] Weight loss  [] Fever  [] Chills Cardiac: [] Chest pain   [] Chest pressure   [] Palpitations   [] Shortness of breath when laying flat   [] Shortness of breath with exertion. Vascular:  [x] Pain in legs with walking   [] Pain in legs at rest  [] History of DVT   [] Phlebitis   [] Swelling in legs   [] Varicose veins   [] Non-healing ulcers Pulmonary:   [] Uses home oxygen   [] Productive cough   [] Hemoptysis   [] Wheeze  [] COPD   [] Asthma Neurologic:  [] Dizziness   [] Seizures   [x] History of stroke   [] History of TIA  [] Aphasia   [] Vissual changes   [] Weakness or numbness in arm   [] Weakness or numbness in leg Musculoskeletal:   [] Joint swelling   [] Joint pain   [] Low back pain Hematologic:  [] Easy bruising  [] Easy bleeding   [] Hypercoagulable state   [] Anemic Gastrointestinal:  [] Diarrhea   [] Vomiting  [] Gastroesophageal reflux/heartburn   [] Difficulty swallowing. Genitourinary:  [] Chronic kidney disease   [] Difficult urination  [] Frequent urination   [] Blood in urine Skin:  [] Rashes   [] Ulcers  Psychological:  [] History of anxiety   []   History of major depression.  Physical Examination  There were no vitals filed for this visit. There is no height or weight on file to calculate BMI. Gen: WD/WN, NAD Head: [place]/AT, No temporalis wasting.  Ear/Nose/Throat: Hearing grossly intact, nares w/o erythema or drainage Eyes: PER, EOMI, sclera nonicteric.  Neck: Supple, no masses.  No bruit or JVD.  Pulmonary:  Good air movement, no audible wheezing, no use of accessory muscles.  Cardiac: RRR, normal S1, S2, no Murmurs. Vascular:  mild trophic changes, no open wounds Vessel Right Left  Radial Palpable Palpable  PT Not Palpable Not Palpable  DP Not Palpable Not Palpable  Gastrointestinal: soft, non-distended. No  guarding/no peritoneal signs.  Musculoskeletal: M/S 5/5 throughout.  No visible deformity.  Neurologic: CN 2-12 intact. Pain and light touch intact in extremities.  Symmetrical.  Speech is fluent. Motor exam as listed above. Psychiatric: Judgment intact, Mood & affect appropriate for pt's clinical situation. Dermatologic: No rashes or ulcers noted.  No changes consistent with cellulitis.   CBC Lab Results  Component Value Date   WBC 6.2 11/14/2021   HGB 12.5 (L) 11/14/2021   HCT 38.3 (L) 11/14/2021   MCV 87.8 11/14/2021   PLT 155 11/14/2021    BMET    Component Value Date/Time   NA 140 11/14/2021 1213   NA 140 03/17/2015 1520   NA 142 04/25/2013 1650   K 3.9 11/14/2021 1213   K 3.5 04/25/2013 1650   CL 106 11/14/2021 1213   CL 107 04/25/2013 1650   CO2 28 11/14/2021 1213   CO2 27 04/25/2013 1650   GLUCOSE 109 (H) 11/14/2021 1213   GLUCOSE 130 (H) 04/25/2013 1650   BUN 19 11/14/2021 1213   BUN 15 03/17/2015 1520   BUN 18 04/25/2013 1650   CREATININE 0.95 11/14/2021 1213   CREATININE 1.02 04/25/2013 1650   CALCIUM 8.6 (L) 11/14/2021 1213   CALCIUM 9.9 04/25/2013 1650   GFRNONAA >60 11/14/2021 1213   GFRNONAA >60 04/25/2013 1650   GFRAA >60 10/08/2019 1740   GFRAA >60 04/25/2013 1650   CrCl cannot be calculated (Patient's most recent lab result is older than the maximum 21 days allowed.).  COAG No results found for: "INR", "PROTIME"  Radiology No results found.   Assessment/Plan There are no diagnoses linked to this encounter.   Hortencia Pilar, MD  07/22/2022 10:58 AM

## 2022-07-25 ENCOUNTER — Encounter (INDEPENDENT_AMBULATORY_CARE_PROVIDER_SITE_OTHER): Payer: Medicare Other

## 2022-07-25 ENCOUNTER — Other Ambulatory Visit (INDEPENDENT_AMBULATORY_CARE_PROVIDER_SITE_OTHER): Payer: Medicare Other

## 2022-07-25 ENCOUNTER — Ambulatory Visit (INDEPENDENT_AMBULATORY_CARE_PROVIDER_SITE_OTHER): Payer: Medicare Other | Admitting: Vascular Surgery

## 2022-08-03 ENCOUNTER — Other Ambulatory Visit (INDEPENDENT_AMBULATORY_CARE_PROVIDER_SITE_OTHER): Payer: Self-pay | Admitting: Vascular Surgery

## 2022-08-03 DIAGNOSIS — I739 Peripheral vascular disease, unspecified: Secondary | ICD-10-CM

## 2022-08-03 DIAGNOSIS — I714 Abdominal aortic aneurysm, without rupture, unspecified: Secondary | ICD-10-CM

## 2022-08-15 ENCOUNTER — Ambulatory Visit (INDEPENDENT_AMBULATORY_CARE_PROVIDER_SITE_OTHER): Payer: Medicare Other | Admitting: Vascular Surgery

## 2022-08-15 ENCOUNTER — Ambulatory Visit (INDEPENDENT_AMBULATORY_CARE_PROVIDER_SITE_OTHER): Payer: Medicare Other

## 2022-08-15 ENCOUNTER — Encounter (INDEPENDENT_AMBULATORY_CARE_PROVIDER_SITE_OTHER): Payer: Self-pay | Admitting: Vascular Surgery

## 2022-08-15 VITALS — BP 158/80 | HR 70 | Resp 16 | Wt 155.2 lb

## 2022-08-15 DIAGNOSIS — I739 Peripheral vascular disease, unspecified: Secondary | ICD-10-CM

## 2022-08-15 DIAGNOSIS — I2510 Atherosclerotic heart disease of native coronary artery without angina pectoris: Secondary | ICD-10-CM | POA: Diagnosis not present

## 2022-08-15 DIAGNOSIS — I10 Essential (primary) hypertension: Secondary | ICD-10-CM

## 2022-08-15 DIAGNOSIS — I714 Abdominal aortic aneurysm, without rupture, unspecified: Secondary | ICD-10-CM | POA: Diagnosis not present

## 2022-08-15 DIAGNOSIS — I7143 Infrarenal abdominal aortic aneurysm, without rupture: Secondary | ICD-10-CM

## 2022-08-15 DIAGNOSIS — E782 Mixed hyperlipidemia: Secondary | ICD-10-CM

## 2022-08-15 LAB — VAS US ABI WITH/WO TBI
Left ABI: 1.15
Right ABI: 1.2

## 2022-08-15 NOTE — Progress Notes (Signed)
MRN : [id number]  Christopher Parrish is a 87 y.o. (April 15, 1936) male who presents with chief complaint of check circulation.  History of Present Illness:   The patient presents to the office for evaluation of an abdominal aortic aneurysm. The aneurysm was found incidentally years ago. She was last seen 07/29/2021.  Patient denies abdominal pain or unusual back pain, no other abdominal complaints.  No history of an acute onset of painful blue discoloration of the toes.      No family history of AAA.    Patient denies amaurosis fugax or TIA symptoms. There is no history of claudication or rest pain symptoms of the lower extremities.  The patient denies angina or shortness of breath.   Previous CT scan shows an AAA that measures 3.30 cm (slight increase in size from 2.7 cm from CT scan on 10/09/2019).  Duplex ultrasound of the abdominal aorta obtained today demonstrates an infrarenal abdominal aortic aneurysm measuring 3.47 cm the iliac arteries are approximately 1 cm bilaterally.  ABIs obtained today Rt=1.20 triphasic waveforms and Lt=1.15 triphasic waveforms  No outpatient medications have been marked as taking for the 08/15/22 encounter (Appointment) with Gilda Crease, Latina Craver, MD.    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   Overview:  left    H/O coronary artery bypass surgery 06/15/2012   Overview:  MIDCAB x1 (partical thoracotomy), LIMA-LAD    Hemorrhagic cerebrovascular accident (CVA) (HCC) 04/26/2013   HLD (hyperlipidemia) 06/11/2012   Intracranial hemorrhage (HCC) 04/25/2013   Nephrolithiasis 03/17/2015   Right lower quadrant pain 03/17/2015    Past Surgical History:  Procedure Laterality Date   CARDIAC SURGERY  2014   bypass    Social History Social  History   Tobacco Use   Smoking status: Former    Types: Cigarettes    Quit date: 05/10/1987    Years since quitting: 35.2   Smokeless tobacco: Never  Vaping Use   Vaping Use: Never used  Substance Use Topics   Alcohol use: No    Alcohol/week: 0.0 standard drinks of alcohol   Drug use: No    Family History Family History  Problem Relation Age of Onset   Diabetes Brother    Cancer Neg Hx     No Known Allergies   REVIEW OF SYSTEMS (Negative unless checked)  Constitutional: [] Weight loss  [] Fever  [] Chills Cardiac: [] Chest pain   [] Chest pressure   [] Palpitations   [] Shortness of breath when laying flat   [] Shortness of breath with exertion. Vascular:  [x] Pain in legs with walking   [] Pain in legs at rest  [] History of DVT   [] Phlebitis   [] Swelling in legs   [] Varicose veins   [] Non-healing ulcers Pulmonary:   [] Uses home oxygen   [] Productive cough   [] Hemoptysis   [] Wheeze  [] COPD   [] Asthma Neurologic:  [] Dizziness   [] Seizures   [] History of stroke   [] History of TIA  [] Aphasia   [] Vissual changes   [] Weakness or numbness in arm   [] Weakness or numbness in leg Musculoskeletal:   [] Joint swelling   [] Joint pain   [] Low back pain Hematologic:  [] Easy bruising  [] Easy bleeding   [] Hypercoagulable state   [] Anemic Gastrointestinal:  []   Diarrhea   [] Vomiting  [] Gastroesophageal reflux/heartburn   [] Difficulty swallowing. Genitourinary:  [] Chronic kidney disease   [] Difficult urination  [] Frequent urination   [] Blood in urine Skin:  [] Rashes   [] Ulcers  Psychological:  [] History of anxiety   []  History of major depression.  Physical Examination  There were no vitals filed for this visit. There is no height or weight on file to calculate BMI. Gen: WD/WN, NAD Head: [place]/AT, No temporalis wasting.  Ear/Nose/Throat: Hearing grossly intact, nares w/o erythema or drainage Eyes: PER, EOMI, sclera nonicteric.  Neck: Supple, no masses.  No bruit or JVD.  Pulmonary:  Good air movement,  no audible wheezing, no use of accessory muscles.  Cardiac: RRR, normal S1, S2, no Murmurs. Vascular:  No open wounds Vessel Right Left  Radial Palpable Palpable  PT Palpable Palpable  DP Palpable Palpable  Gastrointestinal: soft, non-distended. No guarding/no peritoneal signs.  Musculoskeletal: M/S 5/5 throughout.  No visible deformity.  Neurologic: CN 2-12 intact. Pain and light touch intact in extremities.  Symmetrical.  Speech is fluent. Motor exam as listed above. Psychiatric: Judgment intact, Mood & affect appropriate for pt's clinical situation. Dermatologic: No rashes or ulcers noted.  No changes consistent with cellulitis.   CBC Lab Results  Component Value Date   WBC 6.2 11/14/2021   HGB 12.5 (L) 11/14/2021   HCT 38.3 (L) 11/14/2021   MCV 87.8 11/14/2021   PLT 155 11/14/2021    BMET    Component Value Date/Time   NA 140 11/14/2021 1213   NA 140 03/17/2015 1520   NA 142 04/25/2013 1650   K 3.9 11/14/2021 1213   K 3.5 04/25/2013 1650   CL 106 11/14/2021 1213   CL 107 04/25/2013 1650   CO2 28 11/14/2021 1213   CO2 27 04/25/2013 1650   GLUCOSE 109 (H) 11/14/2021 1213   GLUCOSE 130 (H) 04/25/2013 1650   BUN 19 11/14/2021 1213   BUN 15 03/17/2015 1520   BUN 18 04/25/2013 1650   CREATININE 0.95 11/14/2021 1213   CREATININE 1.02 04/25/2013 1650   CALCIUM 8.6 (L) 11/14/2021 1213   CALCIUM 9.9 04/25/2013 1650   GFRNONAA >60 11/14/2021 1213   GFRNONAA >60 04/25/2013 1650   GFRAA >60 10/08/2019 1740   GFRAA >60 04/25/2013 1650   CrCl cannot be calculated (Patient's most recent lab result is older than the maximum 21 days allowed.).  COAG No results found for: "INR", "PROTIME"  Radiology No results found.   Assessment/Plan 1. Infrarenal abdominal aortic aneurysm (AAA) without rupture Recommend: No surgery or intervention is indicated at this time.  The patient has an asymptomatic abdominal aortic aneurysm that is less than 4 cm in maximal diameter.    I  have reviewed the natural history of abdominal aortic aneurysm and the small risk of rupture for aneurysm less than 5 cm in size.  However, as these small aneurysms tend to enlarge over time, continued surveillance with ultrasound or CT scan is mandatory.   I have also discussed optimizing medical management with hypertension and lipid control and the importance of abstinence from tobacco.  The patient is also encouraged to exercise a minimum of 30 minutes 4 times a week.   Should the patient develop new onset abdominal or back pain or signs of peripheral embolization they are instructed to seek medical attention immediately and to alert the physician providing care that they have an aneurysm.   The patient voices their understanding.  The patient will return in 12 months with  an aortic duplex. - VAS US AORTA/IVC/ILIACS; Future  2. PAD (peripheral artery disease) Recommend:  I do not find evidence of life style limiting vascular disease. The patient specifically denies life style limitation.  Previous noninvasive studies including ABI's of the legs do not identify critical vascular problems.  The patient should continue walking and begin a more formal exercise program. The patient should continue his antiplatelet therapy and aggressive treatment of the lipid abnormalities.  The patient is instructed to call the office if there is a significant change in the lower extremity symptoms, particularly if a wound develops or there is an abrupt increase in leg pain.  Patient will follow-up with me on an annual basis  3. Primary hypertension Continue antihypertensive medications as already ordered, these medications have been reviewed and there are no changes at this time.  4. Arteriosclerosis of coronary artery Continue cardiac and antihypertensive medications as already ordered and reviewed, no changes at this time.  Continue statin as ordered and reviewed, no changes at this  time  Nitrates PRN for chest pain  5. Mixed hyperlipidemia Continue statin as ordered and reviewed, no changes at this time    Levora DredgeGregory Maizey Menendez, MD  08/15/2022 8:45 AM

## 2022-11-15 ENCOUNTER — Encounter (INDEPENDENT_AMBULATORY_CARE_PROVIDER_SITE_OTHER): Payer: Medicare Other | Admitting: Ophthalmology

## 2022-11-15 DIAGNOSIS — H318 Other specified disorders of choroid: Secondary | ICD-10-CM | POA: Diagnosis not present

## 2022-11-15 DIAGNOSIS — I10 Essential (primary) hypertension: Secondary | ICD-10-CM

## 2022-11-15 DIAGNOSIS — H59032 Cystoid macular edema following cataract surgery, left eye: Secondary | ICD-10-CM | POA: Diagnosis not present

## 2022-11-15 DIAGNOSIS — H35033 Hypertensive retinopathy, bilateral: Secondary | ICD-10-CM | POA: Diagnosis not present

## 2022-11-15 DIAGNOSIS — H43813 Vitreous degeneration, bilateral: Secondary | ICD-10-CM

## 2023-02-10 ENCOUNTER — Encounter (INDEPENDENT_AMBULATORY_CARE_PROVIDER_SITE_OTHER): Payer: Medicare Other | Admitting: Ophthalmology

## 2023-02-10 DIAGNOSIS — I10 Essential (primary) hypertension: Secondary | ICD-10-CM | POA: Diagnosis not present

## 2023-02-10 DIAGNOSIS — H353231 Exudative age-related macular degeneration, bilateral, with active choroidal neovascularization: Secondary | ICD-10-CM | POA: Diagnosis not present

## 2023-02-10 DIAGNOSIS — H35033 Hypertensive retinopathy, bilateral: Secondary | ICD-10-CM

## 2023-02-10 DIAGNOSIS — H43813 Vitreous degeneration, bilateral: Secondary | ICD-10-CM | POA: Diagnosis not present

## 2023-03-10 ENCOUNTER — Encounter (INDEPENDENT_AMBULATORY_CARE_PROVIDER_SITE_OTHER): Payer: Medicare Other | Admitting: Ophthalmology

## 2023-03-10 DIAGNOSIS — H353231 Exudative age-related macular degeneration, bilateral, with active choroidal neovascularization: Secondary | ICD-10-CM

## 2023-03-10 DIAGNOSIS — H35033 Hypertensive retinopathy, bilateral: Secondary | ICD-10-CM | POA: Diagnosis not present

## 2023-03-10 DIAGNOSIS — H43813 Vitreous degeneration, bilateral: Secondary | ICD-10-CM

## 2023-03-10 DIAGNOSIS — I10 Essential (primary) hypertension: Secondary | ICD-10-CM

## 2023-04-14 ENCOUNTER — Encounter (INDEPENDENT_AMBULATORY_CARE_PROVIDER_SITE_OTHER): Payer: Medicare Other | Admitting: Ophthalmology

## 2023-04-14 DIAGNOSIS — H353231 Exudative age-related macular degeneration, bilateral, with active choroidal neovascularization: Secondary | ICD-10-CM | POA: Diagnosis not present

## 2023-04-14 DIAGNOSIS — H35033 Hypertensive retinopathy, bilateral: Secondary | ICD-10-CM | POA: Diagnosis not present

## 2023-04-14 DIAGNOSIS — I10 Essential (primary) hypertension: Secondary | ICD-10-CM

## 2023-04-14 DIAGNOSIS — H43813 Vitreous degeneration, bilateral: Secondary | ICD-10-CM

## 2023-05-19 ENCOUNTER — Encounter (INDEPENDENT_AMBULATORY_CARE_PROVIDER_SITE_OTHER): Payer: Medicare Other | Admitting: Ophthalmology

## 2023-05-30 ENCOUNTER — Encounter (INDEPENDENT_AMBULATORY_CARE_PROVIDER_SITE_OTHER): Payer: Medicare Other | Admitting: Ophthalmology

## 2023-05-30 DIAGNOSIS — H35033 Hypertensive retinopathy, bilateral: Secondary | ICD-10-CM | POA: Diagnosis not present

## 2023-05-30 DIAGNOSIS — H353231 Exudative age-related macular degeneration, bilateral, with active choroidal neovascularization: Secondary | ICD-10-CM

## 2023-05-30 DIAGNOSIS — H43813 Vitreous degeneration, bilateral: Secondary | ICD-10-CM

## 2023-05-30 DIAGNOSIS — I10 Essential (primary) hypertension: Secondary | ICD-10-CM

## 2023-07-07 ENCOUNTER — Encounter (INDEPENDENT_AMBULATORY_CARE_PROVIDER_SITE_OTHER): Payer: Medicare Other | Admitting: Ophthalmology

## 2023-07-07 DIAGNOSIS — H43813 Vitreous degeneration, bilateral: Secondary | ICD-10-CM

## 2023-07-07 DIAGNOSIS — I10 Essential (primary) hypertension: Secondary | ICD-10-CM | POA: Diagnosis not present

## 2023-07-07 DIAGNOSIS — H35033 Hypertensive retinopathy, bilateral: Secondary | ICD-10-CM | POA: Diagnosis not present

## 2023-07-07 DIAGNOSIS — H353231 Exudative age-related macular degeneration, bilateral, with active choroidal neovascularization: Secondary | ICD-10-CM | POA: Diagnosis not present

## 2023-08-11 ENCOUNTER — Encounter (INDEPENDENT_AMBULATORY_CARE_PROVIDER_SITE_OTHER): Payer: Medicare Other | Admitting: Ophthalmology

## 2023-08-11 DIAGNOSIS — H353231 Exudative age-related macular degeneration, bilateral, with active choroidal neovascularization: Secondary | ICD-10-CM

## 2023-08-11 DIAGNOSIS — I10 Essential (primary) hypertension: Secondary | ICD-10-CM

## 2023-08-11 DIAGNOSIS — H35033 Hypertensive retinopathy, bilateral: Secondary | ICD-10-CM

## 2023-08-11 DIAGNOSIS — H43813 Vitreous degeneration, bilateral: Secondary | ICD-10-CM | POA: Diagnosis not present

## 2023-08-13 NOTE — Progress Notes (Unsigned)
 MRN : [id number]  Christopher Parrish is a 88 y.o. (November 21, 1935) male who presents with chief complaint of check circulation.  History of Present Illness:   The patient presents to the office for evaluation of an abdominal aortic aneurysm. The aneurysm was found incidentally years ago. She was last seen 08/15/2022.  Patient denies abdominal pain or unusual back pain, no other abdominal complaints.  No history of an acute onset of painful blue discoloration of the toes.      No family history of AAA.    Patient denies amaurosis fugax or TIA symptoms. There is no history of claudication or rest pain symptoms of the lower extremities.  The patient denies angina or shortness of breath.   Previous CT scan shows an AAA that measures 3.30 cm (slight increase in size from 2.7 cm from CT scan on 10/09/2019).   Duplex ultrasound of the abdominal aorta obtained today demonstrates an infrarenal abdominal aortic aneurysm measuring 3.47 cm the iliac arteries are approximately 1 cm bilaterally.   ABIs obtained today Rt=1.20 triphasic waveforms and Lt=1.15 triphasic waveforms  No outpatient medications have been marked as taking for the 08/14/23 encounter (Appointment) with Gilda Crease, Latina Craver, MD.    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   Overview:  left    H/O coronary artery bypass surgery 06/15/2012   Overview:  MIDCAB x1 (partical thoracotomy), LIMA-LAD    Hemorrhagic cerebrovascular accident (CVA) (HCC) 04/26/2013   HLD (hyperlipidemia) 06/11/2012   Intracranial hemorrhage (HCC) 04/25/2013   Nephrolithiasis 03/17/2015   Right lower quadrant pain 03/17/2015    Past Surgical History:  Procedure Laterality Date   CARDIAC  SURGERY  2014   bypass    Social History Social History   Tobacco Use   Smoking status: Former    Current packs/day: 0.00    Types: Cigarettes    Quit date: 05/10/1987    Years since quitting: 36.2   Smokeless tobacco: Never  Vaping Use   Vaping status: Never Used  Substance Use Topics   Alcohol use: No    Alcohol/week: 0.0 standard drinks of alcohol   Drug use: No    Family History Family History  Problem Relation Age of Onset   Diabetes Brother    Cancer Neg Hx     No Known Allergies   REVIEW OF SYSTEMS (Negative unless checked)  Constitutional: [] Weight loss  [] Fever  [] Chills Cardiac: [] Chest pain   [] Chest pressure   [] Palpitations   [] Shortness of breath when laying flat   [] Shortness of breath with exertion. Vascular:  [x] Pain in legs with walking   [] Pain in legs at rest  [] History of DVT   [] Phlebitis   [] Swelling in legs   [] Varicose veins   [] Non-healing ulcers Pulmonary:   [] Uses home oxygen   [] Productive cough   [] Hemoptysis   [] Wheeze  [] COPD   [] Asthma Neurologic:  [] Dizziness   []   Seizures   [] History of stroke   [] History of TIA  [] Aphasia   [] Vissual changes   [] Weakness or numbness in arm   [] Weakness or numbness in leg Musculoskeletal:   [] Joint swelling   [] Joint pain   [] Low back pain Hematologic:  [] Easy bruising  [] Easy bleeding   [] Hypercoagulable state   [] Anemic Gastrointestinal:  [] Diarrhea   [] Vomiting  [] Gastroesophageal reflux/heartburn   [] Difficulty swallowing. Genitourinary:  [] Chronic kidney disease   [] Difficult urination  [] Frequent urination   [] Blood in urine Skin:  [] Rashes   [] Ulcers  Psychological:  [] History of anxiety   []  History of major depression.  Physical Examination  There were no vitals filed for this visit. There is no height or weight on file to calculate BMI. Gen: WD/WN, NAD Head: [place]/AT, No temporalis wasting.  Ear/Nose/Throat: Hearing grossly intact, nares w/o erythema or drainage Eyes: PER, EOMI, sclera  nonicteric.  Neck: Supple, no masses.  No bruit or JVD.  Pulmonary:  Good air movement, no audible wheezing, no use of accessory muscles.  Cardiac: RRR, normal S1, S2, no Murmurs. Vascular:  mild trophic changes, no open wounds Vessel Right Left  Radial Palpable Palpable  PT Not Palpable Not Palpable  DP Not Palpable Not Palpable  Gastrointestinal: soft, non-distended. No guarding/no peritoneal signs.  Musculoskeletal: M/S 5/5 throughout.  No visible deformity.  Neurologic: CN 2-12 intact. Pain and light touch intact in extremities.  Symmetrical.  Speech is fluent. Motor exam as listed above. Psychiatric: Judgment intact, Mood & affect appropriate for pt's clinical situation. Dermatologic: No rashes or ulcers noted.  No changes consistent with cellulitis.   CBC Lab Results  Component Value Date   WBC 6.2 11/14/2021   HGB 12.5 (L) 11/14/2021   HCT 38.3 (L) 11/14/2021   MCV 87.8 11/14/2021   PLT 155 11/14/2021    BMET    Component Value Date/Time   NA 140 11/14/2021 1213   NA 140 03/17/2015 1520   NA 142 04/25/2013 1650   K 3.9 11/14/2021 1213   K 3.5 04/25/2013 1650   CL 106 11/14/2021 1213   CL 107 04/25/2013 1650   CO2 28 11/14/2021 1213   CO2 27 04/25/2013 1650   GLUCOSE 109 (H) 11/14/2021 1213   GLUCOSE 130 (H) 04/25/2013 1650   BUN 19 11/14/2021 1213   BUN 15 03/17/2015 1520   BUN 18 04/25/2013 1650   CREATININE 0.95 11/14/2021 1213   CREATININE 1.02 04/25/2013 1650   CALCIUM 8.6 (L) 11/14/2021 1213   CALCIUM 9.9 04/25/2013 1650   GFRNONAA >60 11/14/2021 1213   GFRNONAA >60 04/25/2013 1650   GFRAA >60 10/08/2019 1740   GFRAA >60 04/25/2013 1650   CrCl cannot be calculated (Patient's most recent lab result is older than the maximum 21 days allowed.).  COAG No results found for: "INR", "PROTIME"  Radiology No results found.   Assessment/Plan There are no diagnoses linked to this encounter.   Levora Dredge, MD  08/13/2023 2:10 PM

## 2023-08-14 ENCOUNTER — Encounter (INDEPENDENT_AMBULATORY_CARE_PROVIDER_SITE_OTHER): Payer: Self-pay | Admitting: Vascular Surgery

## 2023-08-14 ENCOUNTER — Ambulatory Visit (INDEPENDENT_AMBULATORY_CARE_PROVIDER_SITE_OTHER): Payer: Medicare Other | Admitting: Vascular Surgery

## 2023-08-14 ENCOUNTER — Ambulatory Visit (INDEPENDENT_AMBULATORY_CARE_PROVIDER_SITE_OTHER): Payer: Medicare Other

## 2023-08-14 VITALS — BP 150/80 | HR 66 | Resp 16 | Wt 157.8 lb

## 2023-08-14 DIAGNOSIS — I7143 Infrarenal abdominal aortic aneurysm, without rupture: Secondary | ICD-10-CM | POA: Diagnosis not present

## 2023-08-14 DIAGNOSIS — E782 Mixed hyperlipidemia: Secondary | ICD-10-CM

## 2023-08-14 DIAGNOSIS — I10 Essential (primary) hypertension: Secondary | ICD-10-CM | POA: Diagnosis not present

## 2023-08-14 DIAGNOSIS — I714 Abdominal aortic aneurysm, without rupture, unspecified: Secondary | ICD-10-CM | POA: Diagnosis not present

## 2023-08-14 DIAGNOSIS — I2510 Atherosclerotic heart disease of native coronary artery without angina pectoris: Secondary | ICD-10-CM | POA: Diagnosis not present

## 2023-08-14 DIAGNOSIS — I739 Peripheral vascular disease, unspecified: Secondary | ICD-10-CM | POA: Diagnosis not present

## 2023-09-15 ENCOUNTER — Encounter (INDEPENDENT_AMBULATORY_CARE_PROVIDER_SITE_OTHER): Admitting: Ophthalmology

## 2023-09-15 DIAGNOSIS — H35033 Hypertensive retinopathy, bilateral: Secondary | ICD-10-CM | POA: Diagnosis not present

## 2023-09-15 DIAGNOSIS — H43813 Vitreous degeneration, bilateral: Secondary | ICD-10-CM | POA: Diagnosis not present

## 2023-09-15 DIAGNOSIS — I10 Essential (primary) hypertension: Secondary | ICD-10-CM | POA: Diagnosis not present

## 2023-09-15 DIAGNOSIS — H353231 Exudative age-related macular degeneration, bilateral, with active choroidal neovascularization: Secondary | ICD-10-CM

## 2023-10-20 ENCOUNTER — Encounter (INDEPENDENT_AMBULATORY_CARE_PROVIDER_SITE_OTHER): Admitting: Ophthalmology

## 2023-11-03 ENCOUNTER — Encounter (INDEPENDENT_AMBULATORY_CARE_PROVIDER_SITE_OTHER): Admitting: Ophthalmology

## 2023-11-03 DIAGNOSIS — H353231 Exudative age-related macular degeneration, bilateral, with active choroidal neovascularization: Secondary | ICD-10-CM | POA: Diagnosis not present

## 2023-11-03 DIAGNOSIS — I10 Essential (primary) hypertension: Secondary | ICD-10-CM

## 2023-11-03 DIAGNOSIS — H43813 Vitreous degeneration, bilateral: Secondary | ICD-10-CM | POA: Diagnosis not present

## 2023-11-03 DIAGNOSIS — H35033 Hypertensive retinopathy, bilateral: Secondary | ICD-10-CM | POA: Diagnosis not present

## 2023-12-22 ENCOUNTER — Encounter (INDEPENDENT_AMBULATORY_CARE_PROVIDER_SITE_OTHER): Admitting: Ophthalmology

## 2023-12-22 DIAGNOSIS — H43813 Vitreous degeneration, bilateral: Secondary | ICD-10-CM | POA: Diagnosis not present

## 2023-12-22 DIAGNOSIS — I10 Essential (primary) hypertension: Secondary | ICD-10-CM

## 2023-12-22 DIAGNOSIS — H35033 Hypertensive retinopathy, bilateral: Secondary | ICD-10-CM

## 2023-12-22 DIAGNOSIS — H353231 Exudative age-related macular degeneration, bilateral, with active choroidal neovascularization: Secondary | ICD-10-CM

## 2024-02-02 ENCOUNTER — Other Ambulatory Visit: Payer: Self-pay

## 2024-02-02 ENCOUNTER — Emergency Department
Admission: EM | Admit: 2024-02-02 | Discharge: 2024-02-02 | Disposition: A | Discharge status: Home / Self Care | Attending: Emergency Medicine | Admitting: Emergency Medicine

## 2024-02-02 ENCOUNTER — Emergency Department

## 2024-02-02 ENCOUNTER — Encounter: Payer: Self-pay | Admitting: Emergency Medicine

## 2024-02-02 DIAGNOSIS — R531 Weakness: Secondary | ICD-10-CM

## 2024-02-02 DIAGNOSIS — R059 Cough, unspecified: Secondary | ICD-10-CM | POA: Insufficient documentation

## 2024-02-02 DIAGNOSIS — N390 Urinary tract infection, site not specified: Secondary | ICD-10-CM | POA: Insufficient documentation

## 2024-02-02 DIAGNOSIS — B9689 Other specified bacterial agents as the cause of diseases classified elsewhere: Secondary | ICD-10-CM | POA: Insufficient documentation

## 2024-02-02 LAB — RESP PANEL BY RT-PCR (RSV, FLU A&B, COVID)  RVPGX2
Influenza A by PCR: NEGATIVE
Influenza B by PCR: NEGATIVE
Resp Syncytial Virus by PCR: NEGATIVE
SARS Coronavirus 2 by RT PCR: NEGATIVE

## 2024-02-02 LAB — URINALYSIS, ROUTINE W REFLEX MICROSCOPIC
Bacteria, UA: NONE SEEN
Bilirubin Urine: NEGATIVE
Glucose, UA: NEGATIVE mg/dL
Ketones, ur: 5 mg/dL — AB
Nitrite: NEGATIVE
Protein, ur: 30 mg/dL — AB
RBC / HPF: >50 RBC/hpf (ref 0–5)
Specific Gravity, Urine: 1.017 (ref 1.005–1.030)
pH: 6 (ref 5.0–8.0)

## 2024-02-02 LAB — COMPREHENSIVE METABOLIC PANEL WITH GFR
ALT: 14 U/L (ref 0–44)
AST: 19 U/L (ref 15–41)
Albumin: 4.1 g/dL (ref 3.5–5.0)
Alkaline Phosphatase: 71 U/L (ref 38–126)
Anion gap: 12 (ref 5–15)
BUN: 20 mg/dL (ref 8–23)
CO2: 26 mmol/L (ref 22–32)
Calcium: 8.9 mg/dL (ref 8.9–10.3)
Chloride: 103 mmol/L (ref 98–111)
Creatinine, Ser: 1.05 mg/dL (ref 0.61–1.24)
GFR, Estimated: >60 mL/min (ref >60)
Glucose, Bld: 149 mg/dL — ABNORMAL HIGH (ref 70–99)
Potassium: 3.5 mmol/L (ref 3.5–5.1)
Sodium: 141 mmol/L (ref 135–145)
Total Bilirubin: 0.7 mg/dL (ref 0.0–1.2)
Total Protein: 6.7 g/dL (ref 6.5–8.1)

## 2024-02-02 LAB — CBC
HCT: 40.9 % (ref 39.0–52.0)
Hemoglobin: 13.5 g/dL (ref 13.0–17.0)
MCH: 28.9 pg (ref 26.0–34.0)
MCHC: 33 g/dL (ref 30.0–36.0)
MCV: 87.6 fL (ref 80.0–100.0)
Platelets: 189 K/uL (ref 150–400)
RBC: 4.67 MIL/uL (ref 4.22–5.81)
RDW: 13.2 % (ref 11.5–15.5)
WBC: 9.6 K/uL (ref 4.0–10.5)
nRBC: 0 % (ref 0.0–0.2)

## 2024-02-02 MED ORDER — SULFAMETHOXAZOLE-TRIMETHOPRIM 800-160 MG PO TABS
1.0000 | ORAL_TABLET | Freq: Once | ORAL | Status: AC
  Administered 2024-02-02: 1 via ORAL
  Filled 2024-02-02: qty 1

## 2024-02-02 MED ORDER — SULFAMETHOXAZOLE-TRIMETHOPRIM 800-160 MG PO TABS: 1.0000 | ORAL_TABLET | Freq: Two times a day (BID) | ORAL | 0 refills | Status: AC

## 2024-02-02 NOTE — ED Provider Notes (Signed)
 [facility] Provider Note    Event Date/Time   First MD Initiated Contact with Patient 02/02/24 1641     (approximate)   History   Weakness   HPI  Christopher Parrish is a 89 y.o. male history of AAA followed by vascular surgery as well as previous hemorrhagic stroke diabetes and coronary bypass  Tells me for about a week he has just been feeling a bit more fatigued and a slight increase in cough.  He has a daily regular cough but feels like he just had a little bit of an increased cough and feeling more fatigued.  No leg swelling no fevers.  He was around someone that he believes had COVID recently.  He is not having bodyaches headache abdominal pain or other symptoms.  No pain or burning with urination.  Still eating and drinking well.  Drove himself to the emergency room today to get evaluated  Denies shortness of breath but reports maybe a slight increase in cough maybe a little bit of runny nose a few days ago     Physical Exam   Triage Vital Signs: ED Triage Vitals [02/02/24 1403]  Encounter Vitals Group     BP (!) 162/77     Girls Systolic BP Percentile      Girls Diastolic BP Percentile      Boys Systolic BP Percentile      Boys Diastolic BP Percentile      Pulse Rate 83     Resp 17     Temp 98.2 F (36.8 C)     Temp Source Oral     SpO2 98 %     Weight 160 lb (72.6 kg)     Height 5' 8 (1.727 m)     Head Circumference      Peak Flow      Pain Score 0     Pain Loc      Pain Education      Exclude from Growth Chart     Most recent vital signs: Vitals:   02/02/24 1649 02/02/24 1819  BP:    Pulse:    Resp:    Temp:  98 F (36.7 C)  SpO2: 100%      General: Awake, no distress.  Very pleasant with normal hemodynamics CV:  Good peripheral perfusion.  Normal tones and rate Resp:  Normal effort.  Clear bilaterally speaks in full clear sentences Abd:  No distention.  Soft nontender nondistended throughout Other:  No lower  extremity edema venous cords or congestion.     ED Results / Procedures / Treatments   Labs (all labs ordered are listed, but only abnormal results are displayed) Labs Reviewed  COMPREHENSIVE METABOLIC PANEL WITH GFR - Abnormal; Notable for the following components:      Result Value   Glucose, Bld 149 (*)    All other components within normal limits  URINALYSIS, ROUTINE W REFLEX MICROSCOPIC - Abnormal; Notable for the following components:   Color, Urine YELLOW (*)    APPearance CLOUDY (*)    Hgb urine dipstick LARGE (*)    Ketones, ur 5 (*)    Protein, ur 30 (*)    Leukocytes,Ua TRACE (*)    All other components within normal limits  RESP PANEL BY RT-PCR (RSV, FLU A&B, COVID)  [id number]  URINE CULTURE  CBC  CBG MONITORING, ED     EKG  Interpreted by me at 1410 heart rate 85 QRS 120 Right bundle  branch block.  No evidence of ischemia  RADIOLOGY  DG Chest 2 View Result Date: 02/02/2024 CLINICAL DATA:  Cough, generalized weakness, COVID exposure EXAM: CHEST - 2 VIEW COMPARISON:  10/08/2019 FINDINGS: Frontal and lateral views of the chest are obtained on 3 images. Elevation of the left hemidiaphragm, with areas of consolidation at the left lung base compatible with atelectasis. No other airspace disease, effusion, or pneumothorax. No acute bony abnormalities. IMPRESSION: 1. Elevated left hemidiaphragm with left basilar atelectasis. 2. No acute airspace disease. Electronically Signed   By: Ozell Daring M.D.   On: 02/02/2024 17:39    Chest x-ray with elevation of left hemidiaphragm slight atelectasis.  No obvious infiltrate   PROCEDURES:  Critical Care performed: No  Procedures   MEDICATIONS ORDERED IN ED: Medications  sulfamethoxazole -trimethoprim  (BACTRIM  DS) 800-160 MG per tablet 1 tablet (has no administration in time range)     IMPRESSION / MDM / ASSESSMENT AND PLAN / ED COURSE  I reviewed the triage vital signs and the nursing notes.                               Differential diagnosis includes, but is not limited to, possible viral type illness preceded by runny nose slight increase in baseline cough, but also having generalized fatigue.  Reports recent COVID exposure but is tested negative he is not febrile his white count is normal vital signs reassuring.  He is awake alert well-oriented clear lungs no acute dyspnea or abnormal findings noted on exam at this time  Very pleasant.  I am suspicious for viral illness, but also in light of his urinalysis today he does have some elements of pyuria and hematuria.  I will treat him for a potential UTI given his age and fatigue, culture sent.  Patient's presentation is most consistent with acute complicated illness / injury requiring diagnostic workup.   ----------------------------------------- 7:51 PM on 02/02/2024 ----------------------------------------- Patient resting comfortably.  Will treat with Bactrim .  Urine sent for culture.  Patient agreeable with this plan careful return precautions.  Recently his PCP left practice, will make referral.  I instructed patient may follow-up with the ER for any ongoing concerns worsening symptoms etc. in the interim.  Return precautions and treatment recommendations and follow-up discussed with the patient who is agreeable with the plan.  Discussed potential side effects of sulfamethoxazole /Bactrim  including immediate discontinuation and precautions around development of rash, sun sensitivity, confusion etc.         FINAL CLINICAL IMPRESSION(S) / ED DIAGNOSES   Final diagnoses:  Generalized weakness  Pyuria due to bacterial urinary tract infection     Rx / DC Orders   ED Discharge Orders          Ordered    Ambulatory Referral to Primary Care (Establish Care)        02/02/24 1947    sulfamethoxazole -trimethoprim  (BACTRIM  DS) 800-160 MG tablet  2 times daily        02/02/24 1948             Note:  This document was prepared using  Dragon voice recognition software and may include unintentional dictation errors.   Dicky Anes, MD 02/02/24 [id number]

## 2024-02-02 NOTE — ED Triage Notes (Signed)
 Patient to ED via POV for generalized weakness with cough and congestion. Ongoing all week. States co-worker tested positive for COVID. States difficult to walk today.

## 2024-02-02 NOTE — Discharge Instructions (Addendum)
 Please return to the emergency room right away if you begin experiencing high fevers, vomiting, feel your symptoms are worsening, he becomes short of breath, start having chest pain abdominal pain, or feel any of your symptoms or concerns or worsening.  Since you do not have a primary care doctor, please feel free to return to the ER and seek evaluation if any ongoing concerns or your symptoms or not improving.  Additionally, I have placed a referral to our primary care team line, please expect a call from them to assist you in establishing a new primary care.

## 2024-02-02 NOTE — ED Notes (Signed)
 D/C information reviewed with the patient by Payton, RN. Pt seen exiting the department.

## 2024-02-02 NOTE — ED Notes (Signed)
 Pt to XR

## 2024-02-02 NOTE — ED Notes (Signed)
 Pt given water and graham crackers. Urinal provided and encouraged to urinate.

## 2024-02-03 LAB — URINE CULTURE: Culture: <10000 — AB

## 2024-02-04 ENCOUNTER — Other Ambulatory Visit: Payer: Self-pay

## 2024-02-04 ENCOUNTER — Emergency Department

## 2024-02-04 ENCOUNTER — Emergency Department
Admission: EM | Admit: 2024-02-04 | Discharge: 2024-02-04 | Disposition: A | Discharge status: Home / Self Care | Attending: Emergency Medicine | Admitting: Emergency Medicine

## 2024-02-04 ENCOUNTER — Encounter: Payer: Self-pay | Admitting: Emergency Medicine

## 2024-02-04 DIAGNOSIS — R531 Weakness: Secondary | ICD-10-CM | POA: Insufficient documentation

## 2024-02-04 DIAGNOSIS — R058 Other specified cough: Secondary | ICD-10-CM | POA: Insufficient documentation

## 2024-02-04 DIAGNOSIS — Z955 Presence of coronary angioplasty implant and graft: Secondary | ICD-10-CM | POA: Insufficient documentation

## 2024-02-04 DIAGNOSIS — R0989 Other specified symptoms and signs involving the circulatory and respiratory systems: Secondary | ICD-10-CM | POA: Insufficient documentation

## 2024-02-04 DIAGNOSIS — I700 Atherosclerosis of aorta: Secondary | ICD-10-CM | POA: Insufficient documentation

## 2024-02-04 DIAGNOSIS — I2510 Atherosclerotic heart disease of native coronary artery without angina pectoris: Secondary | ICD-10-CM | POA: Diagnosis not present

## 2024-02-04 DIAGNOSIS — B349 Viral infection, unspecified: Secondary | ICD-10-CM | POA: Diagnosis not present

## 2024-02-04 DIAGNOSIS — R059 Cough, unspecified: Secondary | ICD-10-CM | POA: Diagnosis present

## 2024-02-04 DIAGNOSIS — N200 Calculus of kidney: Secondary | ICD-10-CM | POA: Insufficient documentation

## 2024-02-04 DIAGNOSIS — E119 Type 2 diabetes mellitus without complications: Secondary | ICD-10-CM | POA: Insufficient documentation

## 2024-02-04 LAB — BASIC METABOLIC PANEL WITH GFR
Anion gap: 11 (ref 5–15)
BUN: 22 mg/dL (ref 8–23)
CO2: 26 mmol/L (ref 22–32)
Calcium: 9.6 mg/dL (ref 8.9–10.3)
Chloride: 100 mmol/L (ref 98–111)
Creatinine, Ser: 1.11 mg/dL (ref 0.61–1.24)
GFR, Estimated: >60 mL/min (ref >60)
Glucose, Bld: 132 mg/dL — ABNORMAL HIGH (ref 70–99)
Potassium: 4.3 mmol/L (ref 3.5–5.1)
Sodium: 137 mmol/L (ref 135–145)

## 2024-02-04 LAB — HEPATIC FUNCTION PANEL
ALT: 14 U/L (ref 0–44)
AST: 15 U/L (ref 15–41)
Albumin: 4.1 g/dL (ref 3.5–5.0)
Alkaline Phosphatase: 72 U/L (ref 38–126)
Bilirubin, Direct: <0.1 mg/dL (ref 0.0–0.2)
Total Bilirubin: 0.8 mg/dL (ref 0.0–1.2)
Total Protein: 7.1 g/dL (ref 6.5–8.1)

## 2024-02-04 LAB — RESP PANEL BY RT-PCR (RSV, FLU A&B, COVID)  RVPGX2
Influenza A by PCR: NEGATIVE
Influenza B by PCR: NEGATIVE
Resp Syncytial Virus by PCR: NEGATIVE
SARS Coronavirus 2 by RT PCR: NEGATIVE

## 2024-02-04 LAB — CBC
HCT: 41.6 % (ref 39.0–52.0)
Hemoglobin: 14 g/dL (ref 13.0–17.0)
MCH: 28.9 pg (ref 26.0–34.0)
MCHC: 33.7 g/dL (ref 30.0–36.0)
MCV: 85.8 fL (ref 80.0–100.0)
Platelets: 199 K/uL (ref 150–400)
RBC: 4.85 MIL/uL (ref 4.22–5.81)
RDW: 13.1 % (ref 11.5–15.5)
WBC: 9.1 K/uL (ref 4.0–10.5)
nRBC: 0 % (ref 0.0–0.2)

## 2024-02-04 LAB — LIPASE, BLOOD: Lipase: 27 U/L (ref 11–51)

## 2024-02-04 MED ORDER — ONDANSETRON HCL 4 MG/2ML IJ SOLN
4.0000 mg | Freq: Once | INTRAMUSCULAR | Status: AC
  Administered 2024-02-04: 4 mg via INTRAVENOUS
  Filled 2024-02-04: qty 2

## 2024-02-04 MED ORDER — ACETAMINOPHEN 500 MG PO TABS
1000.0000 mg | ORAL_TABLET | Freq: Once | ORAL | Status: AC
  Administered 2024-02-04: 1000 mg via ORAL
  Filled 2024-02-04: qty 2

## 2024-02-04 MED ORDER — ONDANSETRON 4 MG PO TBDP: 4.0000 mg | ORAL_TABLET | Freq: Three times a day (TID) | ORAL | 0 refills | Status: AC | PRN

## 2024-02-04 MED ORDER — LACTATED RINGERS IV BOLUS
1000.0000 mL | Freq: Once | INTRAVENOUS | Status: AC
Start: 2024-02-04 — End: 2024-02-04
  Administered 2024-02-04: 1000 mL via INTRAVENOUS

## 2024-02-04 NOTE — ED Provider Notes (Signed)
 [facility] Provider Note    Event Date/Time   First MD Initiated Contact with Patient 02/04/24 1243     (approximate)   History   UTI   HPI  EARLE TROIANO is a 88 y.o. male who presents to the ED for evaluation of UTI   I reviewed ED visit from 2 days ago.  History of AAA, CABG, DM and stroke.  Diagnosed with a possible UTI and started on Bactrim , urine culture with insignificant growth.  Noted to have a high suspicion for viral illness with his constellation of symptoms.  Patient presents for evaluation of continued symptoms of generalized weakness, respiratory congestion, cough, nausea.   Never had urinary symptoms beyond nausea and intermittent emesis, nonbloody nonbilious.  No stool changes such as diarrhea, no dysuria or frequency.   Reports cough that is occasionally productive, poor appetite, generalized weakness.   Physical Exam   Triage Vital Signs: ED Triage Vitals  Encounter Vitals Group     BP 02/04/24 1201 (!) 163/83     Girls Systolic BP Percentile --      Girls Diastolic BP Percentile --      Boys Systolic BP Percentile --      Boys Diastolic BP Percentile --      Pulse Rate 02/04/24 1201 65     Resp 02/04/24 1201 15     Temp 02/04/24 1201 97.7 F (36.5 C)     Temp Source 02/04/24 1201 Oral     SpO2 02/04/24 1201 97 %     Weight 02/04/24 1202 155 lb (70.3 kg)     Height 02/04/24 1202 5' 8 (1.727 m)     Head Circumference --      Peak Flow --      Pain Score 02/04/24 1202 0     Pain Loc --      Pain Education --      Exclude from Growth Chart --     Most recent vital signs: Vitals:   02/04/24 1315 02/04/24 1330  BP: 127/63 123/66  Pulse: 64 61  Resp: 14   Temp:    SpO2: 100% 99%    General: Awake, no distress.  CV:  Good peripheral perfusion.  Resp:  Normal effort.  Abd:  No distention.  Soft and nontender throughout MSK:  No deformity noted.  Neuro:  No focal deficits appreciated. Cranial nerves II  through XII intact 5/5 strength and sensation in all 4 extremities Other:     ED Results / Procedures / Treatments   Labs (all labs ordered are listed, but only abnormal results are displayed) Labs Reviewed  BASIC METABOLIC PANEL WITH GFR - Abnormal; Notable for the following components:      Result Value   Glucose, Bld 132 (*)    All other components within normal limits  RESP PANEL BY RT-PCR (RSV, FLU A&B, COVID)  RVPGX2  CBC  HEPATIC FUNCTION PANEL  LIPASE, BLOOD  URINALYSIS, ROUTINE W REFLEX MICROSCOPIC    EKG   RADIOLOGY CT chest interpreted by me without signs of pneumonia  Official radiology report(s): CT Chest Wo Contrast Result Date: 02/04/2024 CLINICAL DATA:  Productive cough and congestion. Evaluate for pneumonia. EXAM: CT CHEST WITHOUT CONTRAST TECHNIQUE: Multidetector CT imaging of the chest was performed following the standard protocol without IV contrast. RADIATION DOSE REDUCTION: This exam was performed according to the departmental dose-optimization program which includes automated exposure control, adjustment of the mA and/or kV according to patient size  and/or use of iterative reconstruction technique. COMPARISON:  10/09/2019. FINDINGS: Cardiovascular: The heart is normal in size and there is a trace pericardial effusion. Multi-vessel coronary artery calcifications are present. There is atherosclerotic calcification of the aorta without evidence of aneurysm. The pulmonary trunk is normal in caliber. Mediastinum/Nodes: No mediastinal or axillary lymphadenopathy. Evaluation of the hila is limited due to lack of IV contrast. The trachea and esophagus are within normal limits. Lungs/Pleura: Mild pleuroparenchymal scarring is noted at the lung apices bilaterally. Elevation of the left diaphragm is noted. Atelectasis or scarring is noted bilaterally. No consolidation, effusion, or pneumothorax is seen. Scattered 2-3 mm nodules are present in the lungs bilaterally, the  largest in the right upper lobe measuring 3 mm axial image 23. Upper Abdomen: Renal calculi and cysts are noted bilaterally. No acute abnormality. Musculoskeletal: Degenerative changes are present in the thoracic spine. No acute osseous abnormality. IMPRESSION: 1. Atelectasis or scarring at the lung bases. No acute infiltrate is seen. 2. Scattered 2-3 mm nodules in the lungs. No follow-up needed if patient is low-risk (and has no known or suspected primary neoplasm). Non-contrast chest CT can be considered in 12 months if patient is high-risk. This recommendation follows the consensus statement: Guidelines for Management of Incidental Pulmonary Nodules Detected on CT Images: From the Fleischner Society 2017; Radiology 2017; 284:228-243. 3. Bilateral nephrolithiasis. 4. Coronary artery calcifications. 5. Aortic atherosclerosis. Electronically Signed   By: Leita Birmingham M.D.   On: 02/04/2024 14:44    PROCEDURES and INTERVENTIONS:  Procedures  Medications  lactated ringers bolus 1,000 mL (0 mLs Intravenous Stopped 02/04/24 1506)  acetaminophen  (TYLENOL ) tablet 1,000 mg (1,000 mg Oral Given 02/04/24 1351)  ondansetron (ZOFRAN) injection 4 mg (4 mg Intravenous Given 02/04/24 1314)     IMPRESSION / MDM / ASSESSMENT AND PLAN / ED COURSE  I reviewed the triage vital signs and the nursing notes.  Differential diagnosis includes, but is not limited to, viral syndrome, pneumonia, dehydration, deconditioning  {Patient presents with symptoms of an acute illness or injury that is potentially life-threatening.  Patient presents to the ED with generalized weakness that does seem to be respiratory infection, possibly viral URI considering his symptoms.  Questionable UA from 2 days ago with a negative culture and he has no urinary symptoms with seems less likely.  CBC/BMP remain normal, will add on LFTs/lipase considering his nausea and emesis.  Will obtain a noncontrast CT of the chest to assess for pneumonia as  his primary symptoms are respiratory in nature, will provide IV fluids and supportive care.  Patient feeling better with some supportive care and has a reassuring workup.  Clear CT chest, negative COVID swab again.  Strong suspicion for viral syndrome that would most benefit from supportive care.  Medications for medical admission and he is comfortable going home, family can help look after him.  Discussed close return precautions.  I did advise him to stop Bactrim  antibiotics as urine culture is negative, no signs of pneumonia or bacterial infectious etiology of his symptoms and he would likely suffer complications from this medication  Clinical Course as of 02/04/24 1507  Sun Feb 04, 2024  1456 Reassessed and discussed workup overall.  Patient reports feeling a little bit better after some IV fluids, antiemetics and Tylenol .  We discussed workup without clear etiology of his symptoms and no acute pathology.  Discussed high likelihood of a viral etiology of his symptoms.  He is comfortable going home [DS]    Clinical Course User  Index [DS] Claudene Rover, MD     FINAL CLINICAL IMPRESSION(S) / ED DIAGNOSES   Final diagnoses:  Acute viral syndrome     Rx / DC Orders   ED Discharge Orders     None        Note:  This document was prepared using Dragon voice recognition software and may include unintentional dictation errors.   Claudene Rover, MD 02/04/24 [phone]

## 2024-02-04 NOTE — Discharge Instructions (Addendum)
 Stop taking the Bactrim  antibiotics, do not think there is any infection to treat with this and you will likely just deal with side effects  Use Tylenol  for pain and fevers.  Up to 1000 mg per dose, up to 4 times per day.  Do not take more than 4000 mg of Tylenol /acetaminophen  within 24 hours.Christopher Parrish of fluids, try to eat something once per day or at least to use a protein shake like Ensure  Return to the ED with any worsening symptoms

## 2024-02-04 NOTE — ED Triage Notes (Signed)
 Pt came Friday was diagnosed with a UTI. Family states pt has gotten worse, constant nausea, not able to eat. Pt did not inform family of abx prescription so has not been taking abx. Family states picked up meds yesterday. Pt denies any pain, CP/SOB. Pt Aox4 at this time.

## 2024-02-12 ENCOUNTER — Other Ambulatory Visit: Payer: Self-pay

## 2024-02-12 DIAGNOSIS — R112 Nausea with vomiting, unspecified: Secondary | ICD-10-CM

## 2024-02-16 ENCOUNTER — Encounter (INDEPENDENT_AMBULATORY_CARE_PROVIDER_SITE_OTHER): Admitting: Ophthalmology

## 2024-02-16 ENCOUNTER — Ambulatory Visit
Admission: RE | Admit: 2024-02-16 | Discharge: 2024-02-16 | Disposition: A | Discharge status: Home / Self Care | Source: Ambulatory Visit

## 2024-02-16 DIAGNOSIS — R112 Nausea with vomiting, unspecified: Secondary | ICD-10-CM | POA: Insufficient documentation

## 2024-04-18 ENCOUNTER — Encounter (INDEPENDENT_AMBULATORY_CARE_PROVIDER_SITE_OTHER): Admitting: Ophthalmology

## 2024-04-18 DIAGNOSIS — I10 Essential (primary) hypertension: Secondary | ICD-10-CM

## 2024-04-18 DIAGNOSIS — H35033 Hypertensive retinopathy, bilateral: Secondary | ICD-10-CM

## 2024-04-18 DIAGNOSIS — H43813 Vitreous degeneration, bilateral: Secondary | ICD-10-CM

## 2024-04-18 DIAGNOSIS — H353231 Exudative age-related macular degeneration, bilateral, with active choroidal neovascularization: Secondary | ICD-10-CM | POA: Diagnosis not present

## 2024-04-22 ENCOUNTER — Other Ambulatory Visit: Payer: Self-pay

## 2024-04-22 ENCOUNTER — Emergency Department
Admission: EM | Admit: 2024-04-22 | Discharge: 2024-04-22 | Disposition: A | Discharge status: Home / Self Care | Attending: Emergency Medicine | Admitting: Emergency Medicine

## 2024-04-22 ENCOUNTER — Emergency Department

## 2024-04-22 DIAGNOSIS — R7989 Other specified abnormal findings of blood chemistry: Secondary | ICD-10-CM | POA: Diagnosis not present

## 2024-04-22 DIAGNOSIS — M7989 Other specified soft tissue disorders: Secondary | ICD-10-CM

## 2024-04-22 DIAGNOSIS — R519 Headache, unspecified: Secondary | ICD-10-CM | POA: Diagnosis present

## 2024-04-22 DIAGNOSIS — E119 Type 2 diabetes mellitus without complications: Secondary | ICD-10-CM | POA: Diagnosis not present

## 2024-04-22 DIAGNOSIS — I10 Essential (primary) hypertension: Secondary | ICD-10-CM

## 2024-04-22 DIAGNOSIS — I2510 Atherosclerotic heart disease of native coronary artery without angina pectoris: Secondary | ICD-10-CM | POA: Diagnosis not present

## 2024-04-22 DIAGNOSIS — R600 Localized edema: Secondary | ICD-10-CM | POA: Diagnosis not present

## 2024-04-22 LAB — CBC WITH DIFFERENTIAL/PLATELET
Abs Immature Granulocytes: 0 K/uL (ref 0.00–0.07)
Basophils Absolute: 0 K/uL (ref 0.0–0.1)
Basophils Relative: 1 %
Eosinophils Absolute: 0.1 K/uL (ref 0.0–0.5)
Eosinophils Relative: 2 %
HCT: 35 % — ABNORMAL LOW (ref 39.0–52.0)
Hemoglobin: 11.3 g/dL — ABNORMAL LOW (ref 13.0–17.0)
Immature Granulocytes: 0 %
Lymphocytes Relative: 22 %
Lymphs Abs: 0.9 K/uL (ref 0.7–4.0)
MCH: 28.6 pg (ref 26.0–34.0)
MCHC: 32.3 g/dL (ref 30.0–36.0)
MCV: 88.6 fL (ref 80.0–100.0)
Monocytes Absolute: 0.3 K/uL (ref 0.1–1.0)
Monocytes Relative: 8 %
Neutro Abs: 2.7 K/uL (ref 1.7–7.7)
Neutrophils Relative %: 67 %
Platelets: 181 K/uL (ref 150–400)
RBC: 3.95 MIL/uL — ABNORMAL LOW (ref 4.22–5.81)
RDW: 14.4 % (ref 11.5–15.5)
WBC: 4 K/uL (ref 4.0–10.5)
nRBC: 0 % (ref 0.0–0.2)

## 2024-04-22 LAB — BASIC METABOLIC PANEL WITH GFR
Anion gap: 8 (ref 5–15)
BUN: 10 mg/dL (ref 8–23)
CO2: 28 mmol/L (ref 22–32)
Calcium: 8.8 mg/dL — ABNORMAL LOW (ref 8.9–10.3)
Chloride: 107 mmol/L (ref 98–111)
Creatinine, Ser: 0.79 mg/dL (ref 0.61–1.24)
GFR, Estimated: >60 mL/min (ref >60)
Glucose, Bld: 93 mg/dL (ref 70–99)
Potassium: 3.5 mmol/L (ref 3.5–5.1)
Sodium: 143 mmol/L (ref 135–145)

## 2024-04-22 LAB — PRO BRAIN NATRIURETIC PEPTIDE: Pro Brain Natriuretic Peptide: 2082 pg/mL — ABNORMAL HIGH (ref <300.0)

## 2024-04-22 MED ORDER — FUROSEMIDE 40 MG PO TABS
20.0000 mg | ORAL_TABLET | Freq: Once | ORAL | Status: AC
  Administered 2024-04-22: 11:00:00 20 mg via ORAL
  Filled 2024-04-22: qty 1

## 2024-04-22 MED ORDER — FUROSEMIDE 20 MG PO TABS: 20.0000 mg | ORAL_TABLET | Freq: Every day | ORAL | 0 refills | Status: AC

## 2024-04-22 NOTE — ED Notes (Addendum)
 Pt AOX4, denies pain, states BP has been steadily increasing.

## 2024-04-22 NOTE — ED Provider Notes (Signed)
 [facility] Provider Note    Event Date/Time   First MD Initiated Contact with Patient 04/22/24 [phone]     (approximate)   History   Hypertension   HPI  KEEFER SOULLIERE is a 88 y.o. male with history of CAD, diabetes, hyperlipidemia, presenting with high blood pressure.  States that he noted that his blood pressure medications have been high today.  States that medications have been changed recently by primary care doctor.  Had a slight headache earlier.  Denies any headache now, no vision changes, no focal weakness or numbness or slurred speech, no chest pain or shortness of breath.  No recent travel surgeries.  Did note that his bilateral lower extremities are swollen that is new.  Denies history of CHF.  Per independent history from daughter, he has an appointment with his primary care on Thursday.  On independent chart review, he was seen by primary care in November, his blood pressure had dipped in November and so he was discontinued of hydrochlorothiazide and initiated on lisinopril 10 mg daily.     Physical Exam   Triage Vital Signs: ED Triage Vitals  Encounter Vitals Group     BP 04/22/24 0825 (!) 180/121     Girls Systolic BP Percentile --      Girls Diastolic BP Percentile --      Boys Systolic BP Percentile --      Boys Diastolic BP Percentile --      Pulse Rate 04/22/24 0825 74     Resp 04/22/24 0825 18     Temp 04/22/24 0825 97.8 F (36.6 C)     Temp src --      SpO2 04/22/24 0825 97 %     Weight 04/22/24 0826 140 lb (63.5 kg)     Height 04/22/24 0826 5' 8 (1.727 m)     Head Circumference --      Peak Flow --      Pain Score 04/22/24 0825 0     Pain Loc --      Pain Education --      Exclude from Growth Chart --     Most recent vital signs: Vitals:   04/22/24 0930 04/22/24 1000  BP: (!) 161/71 (!) 167/73  Pulse: (!) 55 (!) 59  Resp: 16 17  Temp:    SpO2: 97% 99%     General: Awake, no distress.  CV:  Good peripheral  perfusion.  Resp:  Normal effort.  No tachypnea or respiratory distress Abd:  No distention.  Soft nontender Other:  No slurred speech or facial droop, no focal weakness or numbness, he has bilateral lower extremity edema, right is greater than left.   ED Results / Procedures / Treatments   Labs (all labs ordered are listed, but only abnormal results are displayed) Labs Reviewed  BASIC METABOLIC PANEL WITH GFR - Abnormal; Notable for the following components:      Result Value   Calcium 8.8 (*)    All other components within normal limits  PRO BRAIN NATRIURETIC PEPTIDE - Abnormal; Notable for the following components:   Pro Brain Natriuretic Peptide 2,082.0 (*)    All other components within normal limits  CBC WITH DIFFERENTIAL/PLATELET - Abnormal; Notable for the following components:   RBC 3.95 (*)    Hemoglobin 11.3 (*)    HCT 35.0 (*)    All other components within normal limits     RADIOLOGY On my independent interpretation, ultrasound without  obvious DVT   PROCEDURES:  Critical Care performed: No  Procedures   MEDICATIONS ORDERED IN ED: Medications  furosemide  (LASIX ) tablet 20 mg (has no administration in time range)     IMPRESSION / MDM / ASSESSMENT AND PLAN / ED COURSE  I reviewed the triage vital signs and the nursing notes.                              Differential diagnosis includes, but is not limited to, volume overload, CHF, DVT, electrolyte derangements.  For the hypertension, he is asymptomatic at this time.  Did discuss with him about following up with his primary care doctor on Thursday to discuss adjustments to his blood pressure medications if it is persistently elevated.  Will get labs, BNP, DVT ultrasound.  Patient's presentation is most consistent with acute presentation with potential threat to life or bodily function.  Independent interpretation of labs and imaging below.  On reassessment patient is still asymptomatic.  Discussed with him  about imaging and lab results including incidental findings of elevated BNP.  Given the leg swelling and elevated BNP, will give him a dose of Lasix  here and discharged to with a prescription for Lasix  as well as referral to the heart failure clinic.  Otherwise discussed with him and daughter about following up this week with primary care to get his labs rechecked and to get his blood pressures rechecked to see if they need any adjustments to his medications.  Considered but no indication for inpatient admission at this time, he safe for outpatient management.  Will discharge with strict precautions.  Shared decision making done with patient and daughter and they are agreeable with this plan.    Clinical Course as of 04/22/24 1034  Mon Apr 22, 2024  0940 Independent review of labs, no leukocytosis, electrolytes not severely deranged, creatinine is normal. [TT]  1001 US  Venous Img Lower Bilateral IMPRESSION: 1. No evidence of DVT in either leg. 2. Right calf subcutaneous edema.   [TT]  1023 Pro Brain natriuretic peptide(!) Elevated.  Given his leg swelling.  Will start him on a low-dose of Lasix  here. [TT]    Clinical Course User Index [TT] Waymond Lorelle Cummins, MD     FINAL CLINICAL IMPRESSION(S) / ED DIAGNOSES   Final diagnoses:  Hypertension, unspecified type  Leg swelling  Elevated brain natriuretic peptide (BNP) level     Rx / DC Orders   ED Discharge Orders          Ordered    Ambulatory referral to Cardiology       Comments: If you have not heard from the Cardiology office within the next 72 hours please call [phone].   04/22/24 1032    furosemide  (LASIX ) 20 MG tablet  Daily        04/22/24 1032             Note:  This document was prepared using Dragon voice recognition software and may include unintentional dictation errors.    Waymond Lorelle Cummins, MD 04/22/24 [phone]

## 2024-04-22 NOTE — ED Triage Notes (Addendum)
 Pt comes with c/o HTN. Pt states he is on meds and pcp recently changed it few weeks ago. Pt states since it has gradually been going up.   0630 am 173/90 0650 am191/105 0740 am 186/126  Pt did take his meds this morning. Pt state slight headache earlier

## 2024-04-22 NOTE — Discharge Instructions (Signed)
 Please take your medications as prescribed.  I have prescribed you some Lasix  that you can take for your leg swelling.  I have also put in a referral for you for the heart failure clinic to follow-up.  Please make sure to see your primary care doctor this week to get your labs rechecked to make sure that your electrolytes are not off while on the Lasix .  Also do get your blood pressures rechecked and is you have you need any adjustments.

## 2024-06-13 ENCOUNTER — Encounter (INDEPENDENT_AMBULATORY_CARE_PROVIDER_SITE_OTHER): Admitting: Ophthalmology

## 2024-06-13 DIAGNOSIS — H43813 Vitreous degeneration, bilateral: Secondary | ICD-10-CM

## 2024-06-13 DIAGNOSIS — H35033 Hypertensive retinopathy, bilateral: Secondary | ICD-10-CM

## 2024-06-13 DIAGNOSIS — H353231 Exudative age-related macular degeneration, bilateral, with active choroidal neovascularization: Secondary | ICD-10-CM

## 2024-06-13 DIAGNOSIS — I10 Essential (primary) hypertension: Secondary | ICD-10-CM

## 2024-08-08 ENCOUNTER — Encounter (INDEPENDENT_AMBULATORY_CARE_PROVIDER_SITE_OTHER): Admitting: Ophthalmology

## 2024-08-13 ENCOUNTER — Encounter (INDEPENDENT_AMBULATORY_CARE_PROVIDER_SITE_OTHER)

## 2024-08-13 ENCOUNTER — Ambulatory Visit (INDEPENDENT_AMBULATORY_CARE_PROVIDER_SITE_OTHER): Admitting: Nurse Practitioner
